# Patient Record
Sex: Female | Born: 1940
Health system: Southern US, Community
[De-identification: ages and names within clinical notes are randomized; demographics above are authoritative.]

## PROBLEM LIST (undated history)

## (undated) DIAGNOSIS — E039 Hypothyroidism, unspecified: Secondary | ICD-10-CM

## (undated) DIAGNOSIS — E785 Hyperlipidemia, unspecified: Secondary | ICD-10-CM

## (undated) DIAGNOSIS — C801 Malignant (primary) neoplasm, unspecified: Secondary | ICD-10-CM

## (undated) DIAGNOSIS — R51 Headache: Secondary | ICD-10-CM

## (undated) DIAGNOSIS — M1990 Unspecified osteoarthritis, unspecified site: Secondary | ICD-10-CM

## (undated) DIAGNOSIS — R519 Headache, unspecified: Secondary | ICD-10-CM

## (undated) DIAGNOSIS — T7840XA Allergy, unspecified, initial encounter: Secondary | ICD-10-CM

## (undated) DIAGNOSIS — D126 Benign neoplasm of colon, unspecified: Secondary | ICD-10-CM

## (undated) DIAGNOSIS — M810 Age-related osteoporosis without current pathological fracture: Secondary | ICD-10-CM

## (undated) HISTORY — DX: Hypothyroidism, unspecified: E03.9

## (undated) HISTORY — DX: Allergy, unspecified, initial encounter: T78.40XA

## (undated) HISTORY — PX: THYROIDECTOMY: SHX17

## (undated) HISTORY — DX: Hyperlipidemia, unspecified: E78.5

## (undated) HISTORY — DX: Benign neoplasm of colon, unspecified: D12.6

## (undated) HISTORY — DX: Unspecified osteoarthritis, unspecified site: M19.90

## (undated) HISTORY — DX: Headache: R51

## (undated) HISTORY — DX: Malignant (primary) neoplasm, unspecified: C80.1

## (undated) HISTORY — PX: BREAST LUMPECTOMY: SHX2

## (undated) HISTORY — PX: MEDIAL PARTIAL KNEE REPLACEMENT: SHX5965

## (undated) HISTORY — DX: Headache, unspecified: R51.9

## (undated) HISTORY — DX: Age-related osteoporosis without current pathological fracture: M81.0

## (undated) HISTORY — PX: TIBIA FRACTURE SURGERY: SHX806

---

## 1998-06-12 ENCOUNTER — Emergency Department (HOSPITAL_COMMUNITY): Admission: EM | Admit: 1998-06-12 | Discharge: 1998-06-12 | Payer: Self-pay | Admitting: Emergency Medicine

## 1999-03-02 ENCOUNTER — Other Ambulatory Visit: Admission: RE | Admit: 1999-03-02 | Discharge: 1999-03-02 | Payer: Self-pay | Admitting: Family Medicine

## 2000-10-19 ENCOUNTER — Other Ambulatory Visit: Admission: RE | Admit: 2000-10-19 | Discharge: 2000-10-19 | Payer: Self-pay | Admitting: Family Medicine

## 2003-06-20 ENCOUNTER — Encounter: Admission: RE | Admit: 2003-06-20 | Discharge: 2003-06-20 | Payer: Self-pay | Admitting: General Surgery

## 2003-06-23 ENCOUNTER — Ambulatory Visit (HOSPITAL_BASED_OUTPATIENT_CLINIC_OR_DEPARTMENT_OTHER): Admission: RE | Admit: 2003-06-23 | Discharge: 2003-06-23 | Payer: Self-pay | Admitting: General Surgery

## 2003-06-23 ENCOUNTER — Ambulatory Visit (HOSPITAL_COMMUNITY): Admission: RE | Admit: 2003-06-23 | Discharge: 2003-06-23 | Payer: Self-pay | Admitting: General Surgery

## 2003-07-23 ENCOUNTER — Ambulatory Visit: Admission: RE | Admit: 2003-07-23 | Discharge: 2003-09-17 | Payer: Self-pay | Admitting: Radiation Oncology

## 2003-10-09 ENCOUNTER — Ambulatory Visit: Admission: RE | Admit: 2003-10-09 | Discharge: 2003-10-09 | Payer: Self-pay | Admitting: Radiation Oncology

## 2004-01-22 ENCOUNTER — Inpatient Hospital Stay (HOSPITAL_COMMUNITY): Admission: RE | Admit: 2004-01-22 | Discharge: 2004-01-23 | Payer: Self-pay | Admitting: Orthopedic Surgery

## 2004-04-07 ENCOUNTER — Other Ambulatory Visit: Admission: RE | Admit: 2004-04-07 | Discharge: 2004-04-07 | Payer: Self-pay | Admitting: Family Medicine

## 2004-12-01 ENCOUNTER — Ambulatory Visit: Payer: Self-pay | Admitting: Family Medicine

## 2005-01-04 ENCOUNTER — Ambulatory Visit: Payer: Self-pay | Admitting: Oncology

## 2005-02-15 ENCOUNTER — Ambulatory Visit (HOSPITAL_COMMUNITY): Admission: RE | Admit: 2005-02-15 | Discharge: 2005-02-15 | Payer: Self-pay | Admitting: *Deleted

## 2005-05-11 ENCOUNTER — Ambulatory Visit: Payer: Self-pay | Admitting: Family Medicine

## 2005-05-18 ENCOUNTER — Ambulatory Visit: Payer: Self-pay | Admitting: Family Medicine

## 2006-01-04 ENCOUNTER — Ambulatory Visit: Payer: Self-pay | Admitting: Oncology

## 2006-01-04 LAB — CBC WITH DIFFERENTIAL/PLATELET
BASO%: 0.3 % (ref 0.0–2.0)
Basophils Absolute: 0 10*3/uL (ref 0.0–0.1)
EOS%: 1.6 % (ref 0.0–7.0)
Eosinophils Absolute: 0.1 10*3/uL (ref 0.0–0.5)
HCT: 34.6 % — ABNORMAL LOW (ref 34.8–46.6)
HGB: 11.8 g/dL (ref 11.6–15.9)
LYMPH%: 32.8 % (ref 14.0–48.0)
MCH: 30.6 pg (ref 26.0–34.0)
MCHC: 34 g/dL (ref 32.0–36.0)
MCV: 90.2 fL (ref 81.0–101.0)
MONO#: 0.3 10*3/uL (ref 0.1–0.9)
MONO%: 6 % (ref 0.0–13.0)
NEUT#: 2.9 10*3/uL (ref 1.5–6.5)
NEUT%: 59.3 % (ref 39.6–76.8)
Platelets: 185 10*3/uL (ref 145–400)
RBC: 3.84 10*6/uL (ref 3.70–5.32)
RDW: 13.6 % (ref 11.3–14.5)
WBC: 4.9 10*3/uL (ref 3.9–10.0)
lymph#: 1.6 10*3/uL (ref 0.9–3.3)

## 2006-01-18 ENCOUNTER — Ambulatory Visit (HOSPITAL_COMMUNITY): Admission: RE | Admit: 2006-01-18 | Discharge: 2006-01-18 | Payer: Self-pay | Admitting: Oncology

## 2006-06-07 ENCOUNTER — Ambulatory Visit: Payer: Self-pay | Admitting: Family Medicine

## 2006-06-07 LAB — CONVERTED CEMR LAB
ALT: 16 units/L (ref 0–40)
AST: 27 units/L (ref 0–37)
Albumin: 4 g/dL (ref 3.5–5.2)
Alkaline Phosphatase: 45 units/L (ref 39–117)
BUN: 17 mg/dL (ref 6–23)
Basophils Absolute: 0 10*3/uL (ref 0.0–0.1)
Basophils Relative: 0.5 % (ref 0.0–1.0)
CO2: 28 meq/L (ref 19–32)
Calcium: 9.3 mg/dL (ref 8.4–10.5)
Chloride: 110 meq/L (ref 96–112)
Chol/HDL Ratio, serum: 3.3
Cholesterol: 164 mg/dL (ref 0–200)
Creatinine, Ser: 0.8 mg/dL (ref 0.4–1.2)
Eosinophil percent: 2.2 % (ref 0.0–5.0)
GFR calc non Af Amer: 77 mL/min
Glomerular Filtration Rate, Af Am: 93 mL/min/{1.73_m2}
Glucose, Bld: 95 mg/dL (ref 70–99)
HCT: 39.6 % (ref 36.0–46.0)
HDL: 49.8 mg/dL (ref >39.0)
Hemoglobin: 12.9 g/dL (ref 12.0–15.0)
LDL Cholesterol: 94 mg/dL (ref 0–99)
Lymphocytes Relative: 32.3 % (ref 12.0–46.0)
MCHC: 32.5 g/dL (ref 30.0–36.0)
MCV: 93.5 fL (ref 78.0–100.0)
Monocytes Absolute: 0.4 10*3/uL (ref 0.2–0.7)
Monocytes Relative: 7.1 % (ref 3.0–11.0)
Neutro Abs: 3.4 10*3/uL (ref 1.4–7.7)
Neutrophils Relative %: 57.9 % (ref 43.0–77.0)
Platelets: 189 10*3/uL (ref 150–400)
Potassium: 3.9 meq/L (ref 3.5–5.1)
RBC: 4.23 M/uL (ref 3.87–5.11)
RDW: 12.3 % (ref 11.5–14.6)
Sodium: 144 meq/L (ref 135–145)
TSH: 5 microintl units/mL (ref 0.35–5.50)
Total Bilirubin: 0.5 mg/dL (ref 0.3–1.2)
Total Protein: 6.7 g/dL (ref 6.0–8.3)
Triglyceride fasting, serum: 100 mg/dL (ref 0–149)
VLDL: 20 mg/dL (ref 0–40)
WBC: 5.8 10*3/uL (ref 4.5–10.5)

## 2006-06-14 ENCOUNTER — Ambulatory Visit: Payer: Self-pay | Admitting: Family Medicine

## 2006-06-14 ENCOUNTER — Encounter: Payer: Self-pay | Admitting: Family Medicine

## 2006-06-14 ENCOUNTER — Other Ambulatory Visit: Admission: RE | Admit: 2006-06-14 | Discharge: 2006-06-14 | Payer: Self-pay | Admitting: Family Medicine

## 2006-06-27 ENCOUNTER — Ambulatory Visit: Payer: Self-pay | Admitting: Family Medicine

## 2006-06-28 ENCOUNTER — Ambulatory Visit: Payer: Self-pay

## 2006-12-19 ENCOUNTER — Ambulatory Visit: Payer: Self-pay | Admitting: Gastroenterology

## 2006-12-19 LAB — CONVERTED CEMR LAB
ALT: 16 units/L (ref 0–40)
AST: 22 units/L (ref 0–37)
Albumin: 3.8 g/dL (ref 3.5–5.2)
Alkaline Phosphatase: 41 units/L (ref 39–117)
BUN: 9 mg/dL (ref 6–23)
Basophils Absolute: 0 10*3/uL (ref 0.0–0.1)
Basophils Relative: 0.1 % (ref 0.0–1.0)
Bilirubin, Direct: 0.1 mg/dL (ref 0.0–0.3)
CO2: 29 meq/L (ref 19–32)
Calcium: 8.9 mg/dL (ref 8.4–10.5)
Chloride: 111 meq/L (ref 96–112)
Creatinine, Ser: 0.7 mg/dL (ref 0.4–1.2)
Eosinophils Absolute: 0.1 10*3/uL (ref 0.0–0.6)
Eosinophils Relative: 1.8 % (ref 0.0–5.0)
GFR calc Af Amer: 108 mL/min
GFR calc non Af Amer: 89 mL/min
Glucose, Bld: 95 mg/dL (ref 70–99)
HCT: 35 % — ABNORMAL LOW (ref 36.0–46.0)
Hemoglobin: 11.9 g/dL — ABNORMAL LOW (ref 12.0–15.0)
Lymphocytes Relative: 30.9 % (ref 12.0–46.0)
MCHC: 34.1 g/dL (ref 30.0–36.0)
MCV: 92.3 fL (ref 78.0–100.0)
Monocytes Absolute: 0.4 10*3/uL (ref 0.2–0.7)
Monocytes Relative: 8.4 % (ref 3.0–11.0)
Neutro Abs: 3 10*3/uL (ref 1.4–7.7)
Neutrophils Relative %: 58.8 % (ref 43.0–77.0)
Platelets: 186 10*3/uL (ref 150–400)
Potassium: 4.1 meq/L (ref 3.5–5.1)
RBC: 3.79 M/uL — ABNORMAL LOW (ref 3.87–5.11)
RDW: 12.3 % (ref 11.5–14.6)
Sodium: 143 meq/L (ref 135–145)
TSH: 2.67 microintl units/mL (ref 0.35–5.50)
Total Bilirubin: 0.6 mg/dL (ref 0.3–1.2)
Total Protein: 6.1 g/dL (ref 6.0–8.3)
WBC: 5.1 10*3/uL (ref 4.5–10.5)

## 2006-12-21 ENCOUNTER — Ambulatory Visit: Payer: Self-pay | Admitting: Gastroenterology

## 2006-12-22 ENCOUNTER — Ambulatory Visit: Payer: Self-pay | Admitting: Oncology

## 2006-12-27 LAB — CBC WITH DIFFERENTIAL/PLATELET
BASO%: 0.4 % (ref 0.0–2.0)
Basophils Absolute: 0 10*3/uL (ref 0.0–0.1)
EOS%: 2.7 % (ref 0.0–7.0)
Eosinophils Absolute: 0.1 10*3/uL (ref 0.0–0.5)
HCT: 32.3 % — ABNORMAL LOW (ref 34.8–46.6)
HGB: 11.1 g/dL — ABNORMAL LOW (ref 11.6–15.9)
LYMPH%: 39 % (ref 14.0–48.0)
MCH: 31.5 pg (ref 26.0–34.0)
MCHC: 34.6 g/dL (ref 32.0–36.0)
MCV: 91 fL (ref 81.0–101.0)
MONO#: 0.4 10*3/uL (ref 0.1–0.9)
MONO%: 7.8 % (ref 0.0–13.0)
NEUT#: 2.4 10*3/uL (ref 1.5–6.5)
NEUT%: 50.1 % (ref 39.6–76.8)
Platelets: 170 10*3/uL (ref 145–400)
RBC: 3.54 10*6/uL — ABNORMAL LOW (ref 3.70–5.32)
RDW: 13 % (ref 11.3–14.5)
WBC: 4.8 10*3/uL (ref 3.9–10.0)
lymph#: 1.9 10*3/uL (ref 0.9–3.3)

## 2006-12-27 LAB — COMPREHENSIVE METABOLIC PANEL
ALT: 14 U/L (ref 0–35)
AST: 20 U/L (ref 0–37)
Albumin: 4.3 g/dL (ref 3.5–5.2)
Alkaline Phosphatase: 41 U/L (ref 39–117)
BUN: 12 mg/dL (ref 6–23)
CO2: 26 mEq/L (ref 19–32)
Calcium: 8.3 mg/dL — ABNORMAL LOW (ref 8.4–10.5)
Chloride: 109 mEq/L (ref 96–112)
Creatinine, Ser: 0.7 mg/dL (ref 0.40–1.20)
Glucose, Bld: 94 mg/dL (ref 70–99)
Potassium: 3.6 mEq/L (ref 3.5–5.3)
Sodium: 144 mEq/L (ref 135–145)
Total Bilirubin: 0.3 mg/dL (ref 0.3–1.2)
Total Protein: 6.2 g/dL (ref 6.0–8.3)

## 2006-12-27 LAB — CANCER ANTIGEN 27.29: CA 27.29: 13 U/mL (ref 0–39)

## 2007-06-07 ENCOUNTER — Other Ambulatory Visit: Admission: RE | Admit: 2007-06-07 | Discharge: 2007-06-07 | Payer: Self-pay | Admitting: Family Medicine

## 2007-12-31 ENCOUNTER — Ambulatory Visit: Payer: Self-pay | Admitting: Oncology

## 2008-01-28 LAB — CBC WITH DIFFERENTIAL/PLATELET
BASO%: 0.4 % (ref 0.0–2.0)
Basophils Absolute: 0 10*3/uL (ref 0.0–0.1)
EOS%: 2 % (ref 0.0–7.0)
Eosinophils Absolute: 0.1 10*3/uL (ref 0.0–0.5)
HCT: 35.3 % (ref 34.8–46.6)
HGB: 12.3 g/dL (ref 11.6–15.9)
LYMPH%: 39.5 % (ref 14.0–48.0)
MCH: 30.7 pg (ref 26.0–34.0)
MCHC: 34.8 g/dL (ref 32.0–36.0)
MCV: 88.4 fL (ref 81.0–101.0)
MONO#: 0.5 10*3/uL (ref 0.1–0.9)
MONO%: 8.6 % (ref 0.0–13.0)
NEUT#: 3 10*3/uL (ref 1.5–6.5)
NEUT%: 49.5 % (ref 39.6–76.8)
Platelets: 186 10*3/uL (ref 145–400)
RBC: 3.99 10*6/uL (ref 3.70–5.32)
RDW: 13.5 % (ref 11.3–14.5)
WBC: 6.1 10*3/uL (ref 3.9–10.0)
lymph#: 2.4 10*3/uL (ref 0.9–3.3)

## 2008-01-28 LAB — COMPREHENSIVE METABOLIC PANEL
ALT: 11 U/L (ref 0–35)
AST: 21 U/L (ref 0–37)
Albumin: 3.8 g/dL (ref 3.5–5.2)
Alkaline Phosphatase: 50 U/L (ref 39–117)
BUN: 13 mg/dL (ref 6–23)
CO2: 28 mEq/L (ref 19–32)
Calcium: 8.9 mg/dL (ref 8.4–10.5)
Chloride: 108 mEq/L (ref 96–112)
Creatinine, Ser: 0.64 mg/dL (ref 0.40–1.20)
Glucose, Bld: 84 mg/dL (ref 70–99)
Potassium: 3.7 mEq/L (ref 3.5–5.3)
Sodium: 141 mEq/L (ref 135–145)
Total Bilirubin: 0.6 mg/dL (ref 0.3–1.2)
Total Protein: 6.2 g/dL (ref 6.0–8.3)

## 2008-01-29 LAB — VITAMIN D 25 HYDROXY (VIT D DEFICIENCY, FRACTURES): Vit D, 25-Hydroxy: 40 ng/mL (ref 30–89)

## 2008-01-29 LAB — CANCER ANTIGEN 27.29: CA 27.29: 20 U/mL (ref 0–39)

## 2008-12-05 ENCOUNTER — Ambulatory Visit: Payer: Self-pay | Admitting: Oncology

## 2008-12-09 ENCOUNTER — Encounter: Payer: Self-pay | Admitting: Gastroenterology

## 2008-12-09 LAB — CBC WITH DIFFERENTIAL/PLATELET
BASO%: 0 % (ref 0.0–2.0)
Basophils Absolute: 0 10*3/uL (ref 0.0–0.1)
EOS%: 1.9 % (ref 0.0–7.0)
Eosinophils Absolute: 0.1 10*3/uL (ref 0.0–0.5)
HCT: 34.8 % (ref 34.8–46.6)
HGB: 12.1 g/dL (ref 11.6–15.9)
LYMPH%: 29.3 % (ref 14.0–49.7)
MCH: 31.3 pg (ref 25.1–34.0)
MCHC: 34.7 g/dL (ref 31.5–36.0)
MCV: 90.3 fL (ref 79.5–101.0)
MONO#: 0.3 10*3/uL (ref 0.1–0.9)
MONO%: 5.4 % (ref 0.0–14.0)
NEUT#: 4 10*3/uL (ref 1.5–6.5)
NEUT%: 63.4 % (ref 38.4–76.8)
Platelets: 153 10*3/uL (ref 145–400)
RBC: 3.85 10*6/uL (ref 3.70–5.45)
RDW: 13.4 % (ref 11.2–14.5)
WBC: 6.2 10*3/uL (ref 3.9–10.3)
lymph#: 1.8 10*3/uL (ref 0.9–3.3)

## 2008-12-09 LAB — COMPREHENSIVE METABOLIC PANEL
ALT: 14 U/L (ref 0–35)
AST: 22 U/L (ref 0–37)
Albumin: 3.6 g/dL (ref 3.5–5.2)
Alkaline Phosphatase: 45 U/L (ref 39–117)
BUN: 19 mg/dL (ref 6–23)
CO2: 29 mEq/L (ref 19–32)
Calcium: 8.9 mg/dL (ref 8.4–10.5)
Chloride: 110 mEq/L (ref 96–112)
Creatinine, Ser: 0.67 mg/dL (ref 0.40–1.20)
Glucose, Bld: 89 mg/dL (ref 70–99)
Potassium: 3.8 mEq/L (ref 3.5–5.3)
Sodium: 142 mEq/L (ref 135–145)
Total Bilirubin: 0.4 mg/dL (ref 0.3–1.2)
Total Protein: 6 g/dL (ref 6.0–8.3)

## 2009-03-27 ENCOUNTER — Encounter (INDEPENDENT_AMBULATORY_CARE_PROVIDER_SITE_OTHER): Payer: Self-pay | Admitting: *Deleted

## 2010-12-14 NOTE — Letter (Signed)
Dec 19, 2006    Amy Asal, MD  [address] [place], [place] [address]   RE:  Amy Clements, Amy Clements  MRN:  [id number]  /  DOB:  Nov 18, 1940   Dear Dr. Scotty Court:   Upon your kind referral, I had the pleasure of evaluating your patient  and I am pleased to offer my findings.  I saw Amy Clements in the  office today.  Enclosed is a copy of my progress note that details my  findings and recommendations.   Thank you for the opportunity to participate in your patient's care.    Sincerely,      Barbette Hair. Arlyce Dice, MD,FACG  Electronically Signed    RDK/MedQ  DD: 12/19/2006  DT: 12/19/2006  Job #: [id number]

## 2010-12-14 NOTE — Assessment & Plan Note (Signed)
 HEALTHCARE                         GASTROENTEROLOGY OFFICE NOTE   TUJUANA, KILMARTIN                       MRN:          [id number]  DATE:12/19/2006                            DOB:          01-22-1941    REASON FOR CONSULTATION:  Rectal bleeding.   Ms. Antenucci is a pleasant 70 year old white female referred through the  courtesy of Dr. Scotty Court for evaluation.  She has noted bright red blood  per rectum consisting of small amounts of blood on the toilet tissue.  She does complain of some rectal soreness.  She denies change of bowel  habits or abdominal pain.  She also complains of chronic fatigue.  She  has lost 20 pounds in the past year.  She attributes this to stress  related to taking care of her elderly mother and mother-in-law.   PAST MEDICAL HISTORY:  Pertinent for breast cancer diagnosed in 2005,  and treated with lumpectomy and radiation therapy.  She is on adjuvant  tamoxifen.  Has a history of thyroid disease, and arthritis.  She is  status post hysterectomy.   FAMILY HISTORY:  Pertinent for heart disease in her mother.   Medications include thyroid and tamoxifen.   SHE HAS NO ALLERGIES.   She neither smokes nor drinks.  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   REVIEW OF SYSTEMS:  Positive for joint pains, sleeping problems, back  pain, and anxiety.   EXAMINATION:  Pulse 72.  Blood pressure 132/76.  Weight 127.  HEENT: EOMI. PERRLA. Sclerae are anicteric.  Conjunctivae are pink.  NECK:  Supple without thyromegaly, adenopathy or carotid bruits.  CHEST:  Clear to auscultation and percussion without adventitious  sounds.  CARDIAC:  Regular rhythm; normal S1 S2.  There are no murmurs, gallops  or rubs.  ABDOMEN:  Bowel sounds are normoactive.  Abdomen is soft, non-tender and  non-distended.  There are no abdominal masses, tenderness, splenic  enlargement or hepatomegaly.  EXTREMITIES:  Full range of motion.  No cyanosis,  clubbing or edema.  RECTAL:  She has external skin tags.  There are no masses.  Stool is  Hemoccult negative.   IMPRESSION:  1. Limited rectal bleeding.  Most likely secondary to hemorrhoids.      More proximal colonic source ought to be ruled out.  2. Abnormal weight loss.  This could be stress related.  3. History of breast cancer.   RECOMMENDATION:  1. Colonoscopy.  2. Check CBC, CMET, T4 and TSH.     Barbette Hair. Arlyce Dice, MD,FACG  Electronically Signed    RDK/MedQ  DD: 12/19/2006  DT: 12/19/2006  Job #: [id number]   cc:   Ellin Saba., MD  Valentino Hue Magrinat, M.D.

## 2010-12-17 NOTE — Op Note (Signed)
NAMEFARRYN, LINARES                          ACCOUNT NO.:  [id number]   MEDICAL RECORD NO.:  [id number]                   PATIENT TYPE:  INP   LOCATION:  X002                                 FACILITY:  [facility]   PHYSICIAN:  Madlyn Frankel. Charlann Boxer, M.D.               DATE OF BIRTH:  09/18/1940   DATE OF PROCEDURE:  01/22/2004  DATE OF DISCHARGE:                                 OPERATIVE REPORT   PREOPERATIVE DIAGNOSIS:  Left knee medial compartment osteoarthritis.   POSTOPERATIVE DIAGNOSIS:  Left knee medial compartment osteoarthritis.   PROCEDURE:  Left knee unicompartmental knee replacement utilizing a Biomet  Oxford knee system with a small femur, a size 4 mm meniscal bearing  polyethylene, and a left medial tibial tray measuring 41 x 26 mm.   SURGEON:  Durene Romans, MD   ASSISTANT:  Clarene Reamer, P.A.-C.   ANESTHESIA:  General.   TOURNIQUET TIME:  88 minutes at 300 mmHg.   BLOOD LOSS:  50 mL.   DRAINS:  One.   INDICATION FOR PROCEDURE:  Ms. Boccio is a very pleasant 70 year old female,  who has been followed for some time with medial compartment arthritis.  She  was most recently seen within the past couple of weeks after undergoing  treatment for breast cancer.  She continues to have persistent problems in  the right knee despite conservative measures.  Upon review of her  radiographs and the bone-on-bone nature of the medial compartment, stress x-  rays were ordered.  She had maintenance of her medial collateral ligament  and preservation of lateral compartment cartilage with correction of her  arthritis medially.  On the lateral x-rays, she had evidence of anterior  medial wear indicating a functional, intact anterior cruciate ligament.  After discussing risks and benefits including potential diagnostic and upper  arthroscopy versus unicompartmental knee replacement, she feels that she had  failed all other measures and based on her radiographic appearance, there  was  probably going to be very little success in knee arthroscopy.  After  discussing risks and benefits of the unicompartmental knee replacement, she  consents for above procedure.   PROCEDURE IN DETAIL:  The patient was brought to the operative theatre.  Once adequate anesthesia and preoperative antibiotics were administered, 1 g  of Ancef, the patient was positioned supine with the left leg in the leg  holder.  Left lower extremity was positioned such that flexion of to 120  degrees was attainable.  The left lower extremity was then prepped and  draped over a proximal thigh tourniquet.  A slightly off midline incision  was made, basically from the mid portion of the patella down just proximal  to the tibial tubercle.  Medial parapatellar arthrotomy was carried out.  Debridement of the soft tissue was carried out as well as minor soft tissue  debridement medially.  With this exposure, attention was directed to the  tibial cut  first with the external tibial guide placed parallel to the  tibial shaft.  Osteophytes had been removed off the femoral condyle to allow  for a reciprocating saw placed along the lateral border of the medial  femoral condyle aimed towards the hip center.  Reciprocating saw was taken  down.  Note that initially a conservative cut was made and then after a  trial with a size 4 spacer that was too tight, we resected 3 mm more of  bone.  At this, with the trial component and the 4 mm spacer, this gapped  perfectly.  With this, the size 3 spacer was placed followed by placement of  the femoral drill hole positioner.  Note that the intramedullary device from  the femoral canal was placed approximately 1 cm anterior to the anteromedial  corner of the femoral notch.  With this in position and used as a reference  guide, the femoral drill guide was placed flush onto the femoral condyle and  then parallel in both the sagittal and coronal views.  The position of the  drill guide  in the center portion of the medial lateral aspect of the medial  femoral condyle.  With this in position, drill holes were drilled.  Following this, the posterior femoral cut was made with the jig in place.  At this point, a 0 spigot was placed and the milling device placed over the  medial femoral condyle.  Following removal of remaining bone fragments on  the posterior aspects of the medial and lateral aspect of the medial femoral  condyle, trial reduction was carried out with a small femur and the tibial  component in position.  Initial spacing and flexion determined that the size  4 fit perfectly but in flexion, only a 1 mm jig was able to be placed.  Based on this determination, a 3 mm bone needed to be resected, so a size 3  spigot was placed, and milling commenced on the femur as such.  Further bone  was removed from the central portion as well as around the rim.  At this  point, trial reduction was carried out with size 4 spacer, and the knee came  to full extension and flexion, was stable with good mobility of the meniscal  bearing prosthesis.  Note that procedure was carried out per protocol and  including examination of the spacing block with the knee in 20 degrees of  flexion, not full extension.  Following this trial reduction, attention was  directed at final preparation of the tibial component.  Using the  perpendicular hook device and assuring that the tibial component was as far  posterior as possible with no overhang, it was pinned into position and the  trough created.  Following preparation of the tibial component, final  components were brought to the field.  The knee was copiously irrigated with  normal saline solution.   Utilizing a two-stage cementing technique, the tibial component was cemented  into position first.  Excessive cement was removed with attention to remove  excessive cement posteriorly.  Note that with the tibial component in position, the trial  femur was positioned with the 4 mm poly liner and knee  held at 45 degrees flexion with axial pressure to allow for cement curing.  Excessive cement was removed.  The knee was then reirrigated and tried and  the femoral components put in position again.  Excessive cement was removed,  and the knee was held at 45 degrees with the  4 mm spacer in place.  Following further debridement and assuring there was no further cement  fragments visible, a 4 mm size small meniscal bearing final polyethylene  component was placed into the knee with the snapping sound expected.  Further debridement off of the anterior aspect of the medial femoral condyle  was carried out to prevent impingement of this meniscal bearing, prosthesis  in full extension.  At this point, the knee was copiously irrigated with  normal saline solution; tourniquet was let down.  Further debridement, and  then hemostasis was obtained.  Medium Hemovac drain was placed.  The wound  was then closed in layers with the extensor mechanism reapproximated with a  #1 PDS followed by a running 2-0 underneath the subcutaneous layer and a  running 4-0 Monocryl on the skin.  The patient's wound was then cleaned and  dried and dressed sterilely with Steri-Strips, dressing, sponges, and tape.  The patient tolerated the procedure without complication, was transferred to  the recovery room and extubated.                                               Madlyn Frankel Charlann Boxer, M.D.    MDO/MEDQ  D:  01/22/2004  T:  01/22/2004  Job:  [id number]

## 2010-12-17 NOTE — H&P (Signed)
NAMECLAUDETT, Amy Clements                          ACCOUNT NO.:  [id number]   MEDICAL RECORD NO.:  [id number]                   PATIENT TYPE:   LOCATION:                                       FACILITY:   PHYSICIAN:  Madlyn Frankel. Charlann Boxer, M.D.               DATE OF BIRTH:   DATE OF ADMISSION:  01/22/2004  DATE OF DISCHARGE:                                HISTORY & PHYSICAL   CHIEF COMPLAINT:  Left knee pain.   HISTORY OF PRESENT ILLNESS:  Patient is a 70 year old female who was last  seen by Dr. Charlann Boxer in September, 2004 regarding her left knee.  She has been  followed by Dr. Montez Morita for some time with known medial degenerative changes.  There was a previous discussion of left unicompartmental knee replacements  versus knee arthroscopy.  In the interim since her last visit, she was  diagnosed with breast cancer and undergone a lumpectomy.  She is currently  on Tamoxifen treatment for the next five years.  She continues to have  worsening symptoms in the left knee over the past few months.  She had  previously has had a cortisone injection that only provided relief for a few  weeks.  She continues to take Relafen without much relief of symptoms.  She  has also had some complaints of lower lumbar pain; however, her primary  issue at this point remains her left knee.   Radiographs in the office reveal significant medial compartmental joint  disease.  A stress view of her left knee was obtained, given the fact that  her standing view reveals a close-down medial joint space and bone-on-bone.  Stress radiographs at 20 degrees of flexion and valgus stress reveals that  she has intact lateral compartment and that her deformity is correctable.  She has an intact medial collateral ligament.  Upon these findings, Dr. Charlann Boxer  feels that it is best to proceed with a unicompartmental left knee  replacement.  The patient agrees.  The risks and benefits of the surgery  were discussed with the patient, and the  patient wishes to proceed.   PAST MEDICAL HISTORY:  1. Hypothyroidism.  2. Osteoarthritis.  3. Osteopenia.  4. History of breast cancer.   PAST SURGICAL HISTORY:  1. Thyroidectomy.  2. Hysterectomy.  3. Bladder tack.  4. Tonsillectomy.   MEDICATIONS:  1. Armour thyroid 120 mg 1 p.o. daily.  2. Tamoxifen 20 mg 1 p.o. daily.  3. Multivitamin 1 p.o. daily.  4. Vitamin E 1 p.o. daily.  5. Relafen 750 mg  1 p.o. b.i.d.  6. Caltrate 600 plus D 1 p.o. b.i.d.   ALLERGIES:  No known drug allergies.   SOCIAL HISTORY:  Patient denies any tobacco or alcohol use.  She is married.  She lives in a [place] house with four steps entering her house.   FAMILY HISTORY:  Father has dementia and Parkinson's.  Mother has  hypertension, congestive heart failure, dry mucus membranes, and  osteoarthritis.   REVIEW OF SYSTEMS:  GENERAL:  Positive night sweats and fevers, chills, and  bleeding tendencies.  CNS:  No blurry or double vision, seizures, headaches,  paralysis.  RESPIRATORY:  Denies shortness of breath, productive cough or  hemoptysis.  CV:  Denies chest pain or orthopnea.  GI:  Denies nausea,  vomiting, diarrhea, constipation, melena, or bloody stools.  GU:  Denies  dysuria, hematuria, or discharge.  MUSCULOSKELETAL:  Pertinent to HPI.   PHYSICAL EXAMINATION:  VITAL SIGNS:  Blood pressure 122/74, temp 97.7, pulse  74, respirations 20.  GENERAL:  A well-developed and well-nourished 70 year old female.  HEENT:  Normocephalic and atraumatic.  Pupils are equal, round and reactive  to light.  NECK:  Supple.  No carotid bruits.  LUNGS:  Clear to auscultation bilaterally.  No wheezes or crackles.  HEART:  Regular rate and rhythm without murmurs, rubs or gallops.  ABDOMEN:  Soft, nontender, nondistended.  Positive bowel sounds x4.  EXTREMITIES:  She has range of motion from 0 to 130 degrees.  She has medial  pain on her left knee with intact ligaments.  She is neurovascularly intact   distally.  SKIN:  No rashes or lesions.   Radiographic reveals significant medial compartmental joint disease.   IMPRESSION:  1. Medial compartmental osteoarthritis of the left knee.  2. Hypothyroidism.  3. Osteoarthritis.  4. Osteopenia.  5. History of breast cancer.   PLAN:  Patient will be admitted to [facility] on January 22, 2004 to  undergo unicompartmental left knee replacement versus a total knee  replacement by Dr. Durene Romans.     Clarene Reamer, P.A.-C.                   Madlyn Frankel Charlann Boxer, M.D.    SW/MEDQ  D:  01/21/2004  T:  01/21/2004  Job:  [id number]   cc:   Ellin Saba., M.D.  [address] [place]  [place] [address]  Fax: [phone]

## 2012-09-17 ENCOUNTER — Ambulatory Visit
Admission: RE | Admit: 2012-09-17 | Discharge: 2012-09-17 | Disposition: A | Discharge status: Home / Self Care | Payer: Medicare Other | Source: Ambulatory Visit | Attending: Physician Assistant | Admitting: Physician Assistant

## 2012-09-17 ENCOUNTER — Other Ambulatory Visit: Payer: Self-pay | Admitting: Physician Assistant

## 2012-09-17 DIAGNOSIS — S0990XA Unspecified injury of head, initial encounter: Secondary | ICD-10-CM

## 2012-09-17 DIAGNOSIS — R1110 Vomiting, unspecified: Secondary | ICD-10-CM

## 2012-09-17 DIAGNOSIS — R51 Headache: Secondary | ICD-10-CM

## 2012-09-17 DIAGNOSIS — R42 Dizziness and giddiness: Secondary | ICD-10-CM

## 2013-05-01 ENCOUNTER — Other Ambulatory Visit: Payer: Self-pay | Admitting: Family Medicine

## 2013-05-01 DIAGNOSIS — R51 Headache: Secondary | ICD-10-CM

## 2013-05-06 ENCOUNTER — Ambulatory Visit
Admission: RE | Admit: 2013-05-06 | Discharge: 2013-05-06 | Disposition: A | Discharge status: Home / Self Care | Payer: Medicare Other | Source: Ambulatory Visit | Attending: Family Medicine | Admitting: Family Medicine

## 2013-05-06 DIAGNOSIS — R51 Headache: Secondary | ICD-10-CM

## 2013-05-16 ENCOUNTER — Ambulatory Visit (INDEPENDENT_AMBULATORY_CARE_PROVIDER_SITE_OTHER): Payer: Medicare Other | Admitting: Neurology

## 2013-05-16 ENCOUNTER — Encounter: Payer: Self-pay | Admitting: Neurology

## 2013-05-16 VITALS — BP 130/72 | HR 66 | Temp 97.9°F | Ht 63.0 in | Wt 143.0 lb

## 2013-05-16 DIAGNOSIS — G44301 Post-traumatic headache, unspecified, intractable: Secondary | ICD-10-CM

## 2013-05-16 DIAGNOSIS — G44309 Post-traumatic headache, unspecified, not intractable: Secondary | ICD-10-CM

## 2013-05-16 NOTE — Progress Notes (Signed)
NEUROLOGY CONSULTATION NOTE  Amy Clements MRN: [id number] DOB: [birth year]  Referring provider: Dr. Nathanial Rancher Primary care provider: Dr. Nathanial Rancher  Reason for consult:  Chronic daily headaches  HISTORY OF PRESENT ILLNESS: Amy Clements is a 72 year old right-handed woman with history of post-surgical hypothyroidism and remote history of breast cancer, who presents for headache.  She is accompanied by her husband.  Records and images were personally reviewed where available.   Onset of headaches began after a fall in March 2014.  She was walking down the steps of a semi-tractor when she fell backwards, hitting the back of her head.  She did not lose consciousness.  She did sustain a scalp laceration but did not require stitches.  She felt fairly okay the rest of the day.  However, the next day she developed severe headache.  The headache is located on the top of her head, but changes locations.  It is not located front, side or back of head.  Initially it was severe throbbing pain, 8-9/10.  Now it is a dull non-throbbing pain, 4-5/10.  She denies any acute neck pain.  She notes mild phonophobia but it is not associated with nausea, vomiting, photophobia, or osmophobia.  She is usually able to work through the headache.  It occurs daily, usually worse at night.  Sometimes, it wakes her up at night.  It can last anywhere from a few minutes to 2 hours.  She usually takes Tylenol, which doesn't really help.  She takes it anywhere from 2 to 3 times a week.  She has tried tramadol and ibuprofen as well, which were ineffective.  She usually does not like taking medications.  She denies visual disturbance, change in hearing, facial numbness, slurred speech or ataxia.  At the time of injury, she had a CT scan of the head (dated February 17, but patient tells me it was March).  It revealed mild cerebral and cerebellar atrophy with chronic small vessel ischemic changes in the periventricular white matter.  No  displaced skull fracture or hemorrhage was seen.  As the symptoms persisted, she had a repeat CT of head performed 05/06/13, which was stable.  She sees a [facility] once a month for her back, which seems to help the headache briefly.  She had her eye prescription changed, which didn't help with the headache.  She denies difficulty sleeping or depression.  She does have a history of "premenstrual headaches" and headaches several years ago which spontaneously resolved.  PAST MEDICAL HISTORY: Past Medical History  Diagnosis Date  . Hypothyroidism     PAST SURGICAL HISTORY: Past Surgical History  Procedure Laterality Date  . Thyroidectomy    . Tibia fracture surgery    . Breast lumpectomy    . Medial partial knee replacement      MEDICATIONS: No current outpatient prescriptions on file prior to visit.   No current facility-administered medications on file prior to visit.    ALLERGIES: No Known Allergies  FAMILY HISTORY: No family history on file.  SOCIAL HISTORY: History   Social History  . Marital Status: Married    Spouse Name: N/A    Number of Children: N/A  . Years of Education: N/A   Occupational History  . Not on file.   Social History Main Topics  . Smoking status: Never Smoker   . Smokeless tobacco: Never Used  . Alcohol Use: No  . Drug Use: No  . Sexual Activity: Not on file   Other Topics Concern  .  Not on file   Social History Narrative  . No narrative on file    REVIEW OF SYSTEMS: Constitutional: No fevers, chills, or sweats, no generalized fatigue, change in appetite Eyes: No visual changes, double vision, eye pain Ear, nose and throat: No hearing loss, ear pain, nasal congestion, sore throat Cardiovascular: No chest pain, palpitations Respiratory:  No shortness of breath at rest or with exertion, wheezes GastrointestinaI: No nausea, vomiting, diarrhea, abdominal pain, fecal incontinence Genitourinary:  No dysuria, urinary retention or  frequency Musculoskeletal:  No neck pain, back pain Integumentary: No rash, pruritus, skin lesions Neurological: as above Psychiatric: No depression, insomnia, anxiety Endocrine: No palpitations, fatigue, diaphoresis, mood swings, change in appetite, change in weight, increased thirst Hematologic/Lymphatic:  No anemia, purpura, petechiae. Allergic/Immunologic: no itchy/runny eyes, nasal congestion, recent allergic reactions, rashes  PHYSICAL EXAM: Filed Vitals:   05/16/13 0832  BP: 130/72  Pulse: 66  Temp: 97.9 F (36.6 C)   General: No acute distress Head:  Normocephalic/atraumatic Neck: supple, no paraspinal tenderness, full range of motion Back: No paraspinal tenderness Heart: regular rate and rhythm Lungs: Clear to auscultation bilaterally. Vascular: No carotid bruits. Neurological Exam: Mental status: alert and oriented to person, place, and time, speech fluent and not dysarthric, language intact. Cranial nerves: CN I: not tested CN II: pupils equal, round and reactive to light, visual fields intact, fundi unremarkable. CN III, IV, VI:  full range of motion, no nystagmus, no ptosis CN V: facial sensation intact CN VII: upper and lower face symmetric CN VIII: hearing intact CN IX, X: gag intact, uvula midline CN XI: sternocleidomastoid and trapezius muscles intact CN XII: tongue midline Bulk & Tone: normal, no fasciculations. Motor: 5/5 throughout Sensation: pinprick and vibration intact Deep Tendon Reflexes: 2+ throughout, toes down Finger to nose testing: normal without dysmetria Heel to shin: normal without dysmetria Gait: normal stance and stride.  Able to walk on toes, heels and in tandem. Romberg negative.  IMPRESSION: Post-traumatic headaches, intractable.  Actually improving in intensity but still daily.  I do not suspect anything serious that would warrant MRI at this time.  Suggested pharmacological options, but she would like to try alternative  therapy.  PLAN: 1.  Ask chiropractor about performing adjustments of the neck.  Alternatively, we can refer to physical therapy for deep tissue massage of neck. 2.  Limit use of pain medications to no more than 2 days out of the week to prevent rebound headache. 3.  Stay hydrated and get proper sleep. 4.  Follow up in 2 months.  45 minutes spent with patient and husband, over 50% spent counseling and coordinating care.  Thank you for allowing me to take part in the care of this patient.  Shon Millet, DO  CC:  Burnell Blanks, MD

## 2013-05-16 NOTE — Patient Instructions (Signed)
Likely you have chronic headaches due to the head injury.  Your neurological exam is normal, so I do not suspect anything serious. 1.  Ask your chiropractor about performing adjustments of the neck.  Alternatively, we can refer you to physical therapy for deep tissue massage of neck. 2.  Limit use of pain medications to no more than 2 days out of the week to prevent rebound headache. 3.  Stay hydrated and get proper sleep. 4.  Follow up in 2 months.

## 2013-07-16 ENCOUNTER — Ambulatory Visit (INDEPENDENT_AMBULATORY_CARE_PROVIDER_SITE_OTHER): Payer: Medicare Other | Admitting: Neurology

## 2013-07-16 ENCOUNTER — Encounter: Payer: Self-pay | Admitting: Neurology

## 2013-07-16 VITALS — BP 128/76 | HR 72 | Temp 98.1°F | Ht 63.0 in | Wt 144.0 lb

## 2013-07-16 DIAGNOSIS — G44309 Post-traumatic headache, unspecified, not intractable: Secondary | ICD-10-CM

## 2013-07-16 NOTE — Patient Instructions (Addendum)
Ask your chiropractor about seeing him for a neck adjustment on a regular basis to help prevent the headaches.  Follow up in 3 months.

## 2013-07-16 NOTE — Progress Notes (Signed)
NEUROLOGY FOLLOW UP OFFICE NOTE  Amy Clements [id number]  HISTORY OF PRESENT ILLNESS: Amy Clements is a 72 year old right-handed woman with history of premenstrual migraines, post-surgical hypothyroidism and remote history of breast cancer, who follows up for post-traumatic headaches.  She is accompanied by her husband.  Records and images were personally reviewed where available.    Onset of headaches began after a fall in March 2014.  She was walking down the steps of a semi-tractor when she fell backwards, hitting the back of her head.  She did not lose consciousness.  She did sustain a scalp laceration but did not require stitches.  She felt fairly okay the rest of the day.  However, the next day she developed severe headache.  The headache is located on the top of her head, but changes locations.  It is not located front, side or back of head.  Initially it was severe throbbing pain, 8-9/10.  Now it is a dull non-throbbing pain, 4-5/10.  She denies any acute neck pain.  She denies visual disturbance, change in hearing, facial numbness, slurred speech or ataxia.  She notes mild phonophobia but it is not associated with nausea, vomiting, photophobia, or osmophobia. She is usually able to work through the headache.  It occurs daily, usually worse at night.  Sometimes, it wakes her up at night.  It can last anywhere from a few minutes to 2 hours.   Last visit, I suggested seeing her chiropractor for neck adjustments.  She started seeing him when the headaches were bad, and they resolved the headache.  Since last visit, she has a headache about 15 days out of the week.  They are not debilitating and are manageable.  They usually last a few minutes to an hour.  She only takes medication if it lasts at least 2-3 hours and that only occurs about 4 days out of the month.  She will usually take Tylenol, which helps.  At the time of injury, she had a CT scan of the head (dated February 17, but patient  tells me it was March).  It revealed mild cerebral and cerebellar atrophy with chronic small vessel ischemic changes in the periventricular white matter.  No displaced skull fracture or hemorrhage was seen.  As the symptoms persisted, she had a repeat CT of head performed 05/06/13, which was stable.    Therapies tried:  Tylenol, tramadol, ibuprofen (ineffective), chiropractor  PAST MEDICAL HISTORY: Past Medical History  Diagnosis Date  . Hypothyroidism   . HA (headache)     MEDICATIONS: Current Outpatient Prescriptions on File Prior to Visit  Medication Sig Dispense Refill  . Ascorbic Acid (VITAMIN C) 1000 MG tablet Take 1,000 mg by mouth daily.      Marland Kitchen aspirin 81 MG tablet Take 81 mg by mouth daily.      . fish oil-omega-3 fatty acids 1000 MG capsule Take 2 g by mouth daily.      Marland Kitchen glucosamine-chondroitin 500-400 MG tablet Take 1 tablet by mouth 3 (three) times daily.      Marland Kitchen levothyroxine (SYNTHROID, LEVOTHROID) 125 MCG tablet       . multivitamin-iron-minerals-folic acid (CENTRUM) chewable tablet Chew 1 tablet by mouth daily.      . pravastatin (PRAVACHOL) 20 MG tablet       . Vitamin D, Ergocalciferol, (DRISDOL) 50000 UNITS CAPS capsule Take 50,000 Units by mouth.      . traMADol (ULTRAM) 50 MG tablet Take 50 mg by mouth  every 6 (six) hours as needed for pain.       No current facility-administered medications on file prior to visit.    ALLERGIES: No Known Allergies  FAMILY HISTORY: Family History  Problem Relation Age of Onset  . Ataxia Neg Hx   . Chorea Neg Hx   . Dementia Neg Hx   . Mental retardation Neg Hx   . Migraines Neg Hx   . Multiple sclerosis Neg Hx   . Neurofibromatosis Neg Hx   . Neuropathy Neg Hx   . Parkinsonism Neg Hx   . Seizures Neg Hx   . Stroke Neg Hx     SOCIAL HISTORY: History   Social History  . Marital Status: Married    Spouse Name: N/A    Number of Children: N/A  . Years of Education: N/A   Occupational History  . Not on file.    Social History Main Topics  . Smoking status: Never Smoker   . Smokeless tobacco: Never Used  . Alcohol Use: No  . Drug Use: No  . Sexual Activity: Not on file   Other Topics Concern  . Not on file   Social History Narrative  . No narrative on file    REVIEW OF SYSTEMS: Constitutional: No fevers, chills, or sweats, no generalized fatigue, change in appetite Eyes: No visual changes, double vision, eye pain Ear, nose and throat: No hearing loss, ear pain, nasal congestion, sore throat Cardiovascular: No chest pain, palpitations Respiratory:  No shortness of breath at rest or with exertion, wheezes GastrointestinaI: No nausea, vomiting, diarrhea, abdominal pain, fecal incontinence Genitourinary:  No dysuria, urinary retention or frequency Musculoskeletal:  No neck pain, back pain Integumentary: No rash, pruritus, skin lesions Neurological: as above Psychiatric: No depression, insomnia, anxiety Endocrine: No palpitations, fatigue, diaphoresis, mood swings, change in appetite, change in weight, increased thirst Hematologic/Lymphatic:  No anemia, purpura, petechiae. Allergic/Immunologic: no itchy/runny eyes, nasal congestion, recent allergic reactions, rashes  PHYSICAL EXAM: Filed Vitals:   07/16/13 0843  BP: 128/76  Pulse: 72  Temp: 98.1 F (36.7 C)   General: No acute distress Head:  Normocephalic/atraumatic Neck: supple, no paraspinal tenderness, full range of motion Heart:  Regular rate and rhythm Lungs:  Clear to auscultation bilaterally Back: No paraspinal tenderness Neurological Exam: alert and oriented to person, place, and time. Speech fluent and not dysarthric, language intact.  CN II-XII intact. Bulk and tone normal, muscle strength 5/5 throughout.  Sensation to light touch intact.  Deep tendon reflexes 1+ throughout.  Finger to nose testing intact.  Gait normal, Romberg negative.  IMPRESSION: Post-traumatic headaches, improved.  PLAN: Advised that she get  more regular neck adjustments to serve as a headache preventative, rather than waiting until a headache occurs.  Follow up in 3 months.  30 minutes spent with patient and husband, over 50% spent counseling and coordinating care.  Shon Millet, DO  CC:  Burnell Blanks, MD

## 2013-07-31 ENCOUNTER — Other Ambulatory Visit: Payer: Self-pay | Admitting: Gastroenterology

## 2013-10-17 ENCOUNTER — Ambulatory Visit: Payer: Medicare Other | Admitting: Neurology

## 2014-02-10 IMAGING — CT CT HEAD W/O CM
2 series · 16 of 30 positions shown, 18 images · non-contrast
Comparison: Brain MRI 12/05/2007.

CLINICAL DATA: Headache.  Head trauma.  Dizziness and vomiting.

CT HEAD WITHOUT CONTRAST
TECHNIQUE: Contiguous axial images were obtained from the base of
the skull through the vertex without contrast.

[Series 3: head bone · axial · 0.45mm/px · z∈[+8,+119]mm · 8 of 56 slices shown]
[im 6/56  bone]
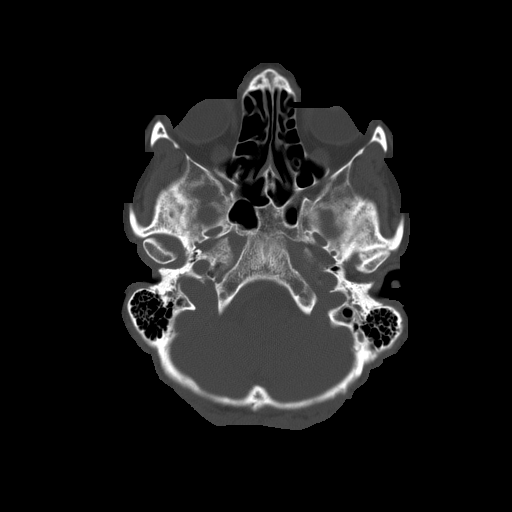
[im 12/56  bone]
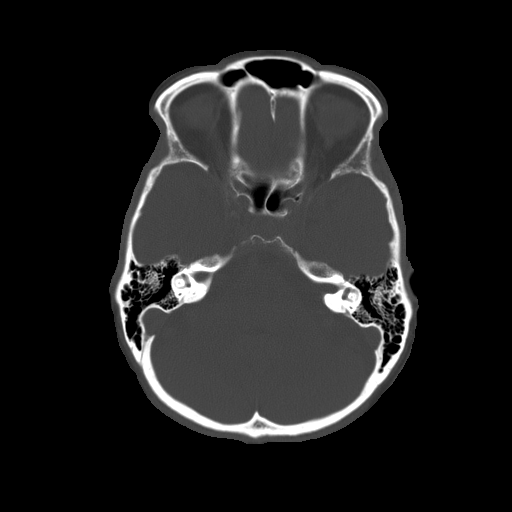
[im 18/56  bone]
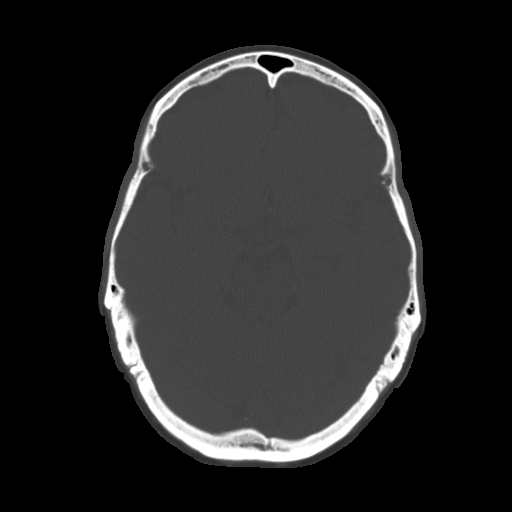
[im 24/56  bone]
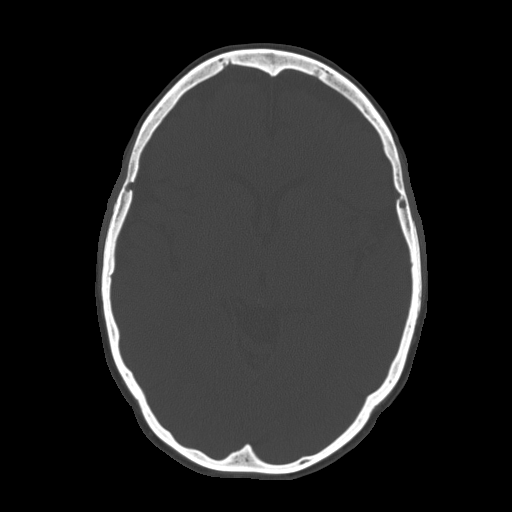
[im 32/56  bone]
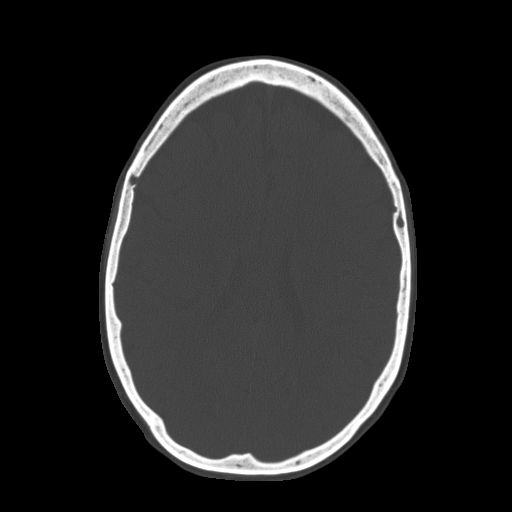
[im 38/56  bone]
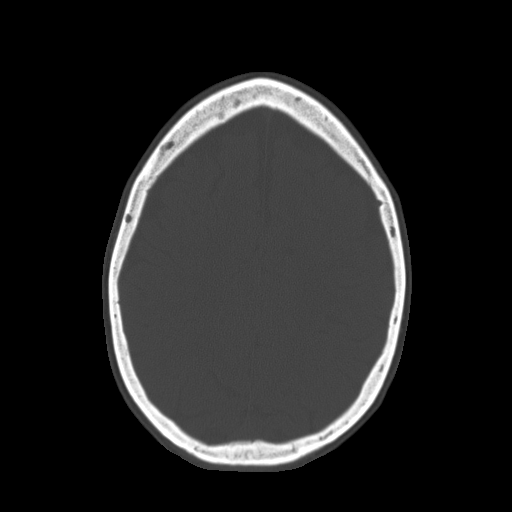
[im 44/56  bone]
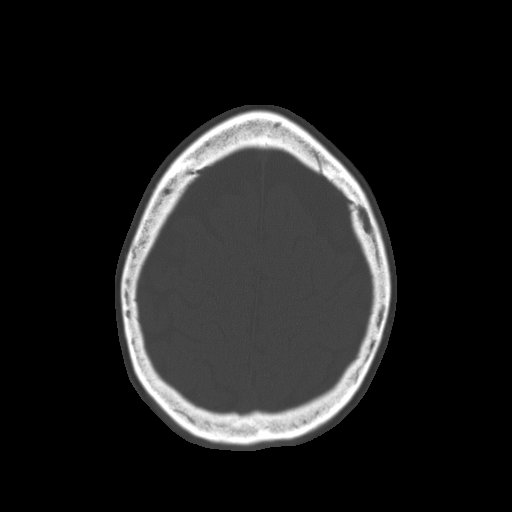
[im 50/56  bone]
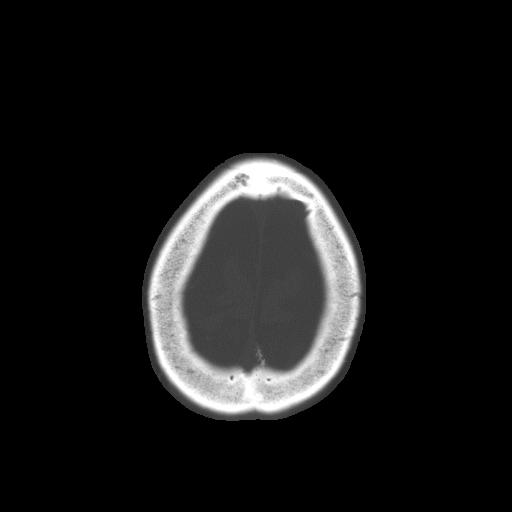

[Series 32: 3d filtered head w/o · axial · non-contrast · 0.45mm/px · z∈[+12,+117]mm · 8 of 28 slices shown, 10 images]
[im 4/28  brain]
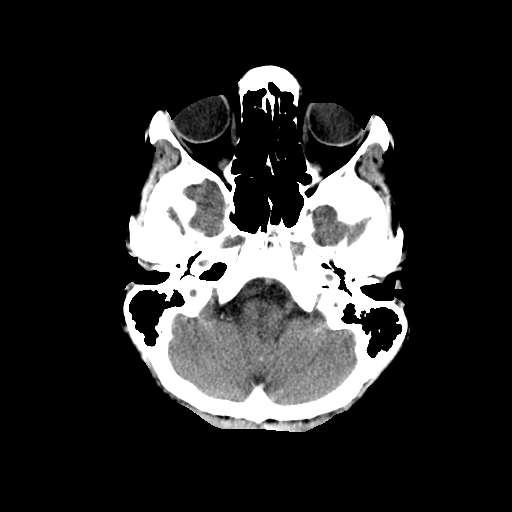
[im 4/28  bone]
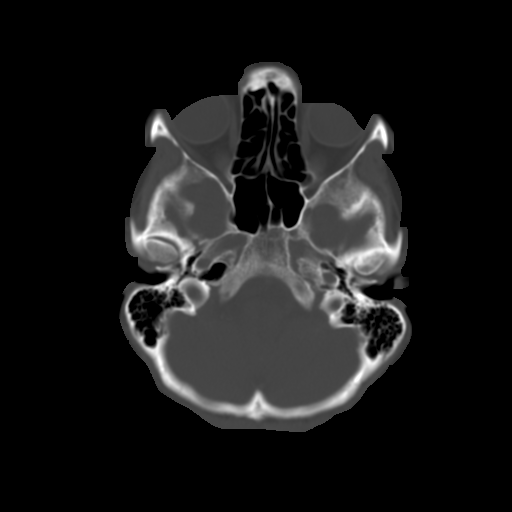
[im 7/28  brain]
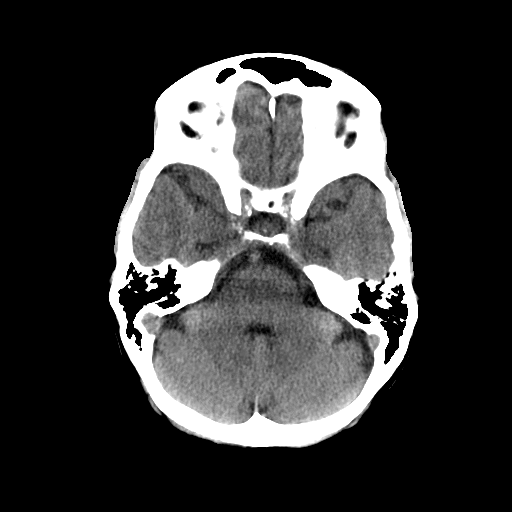
[im 10/28  brain]
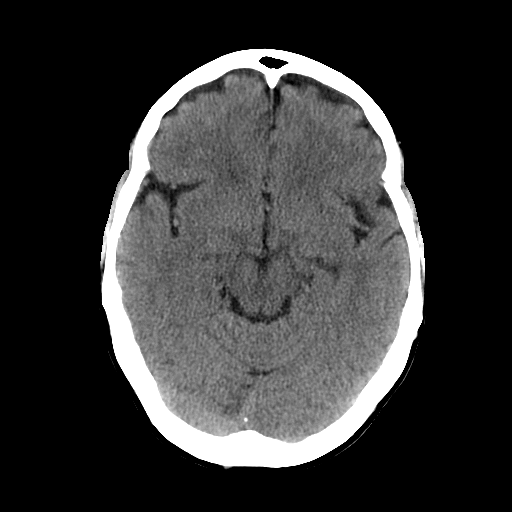
[im 13/28  brain]
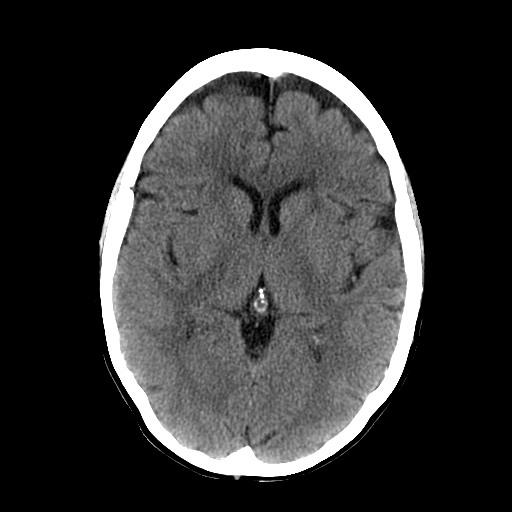
[im 16/28  brain]
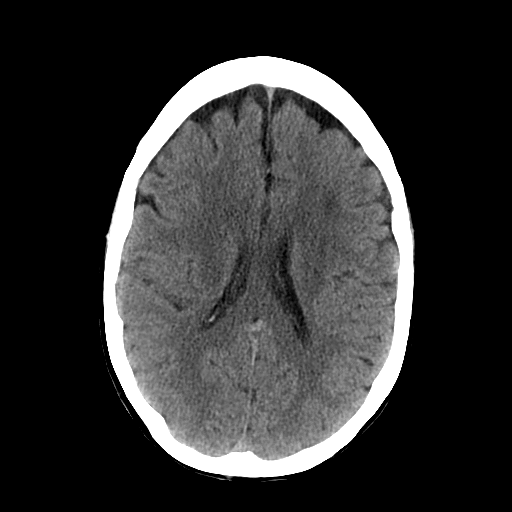
[im 16/28  bone]
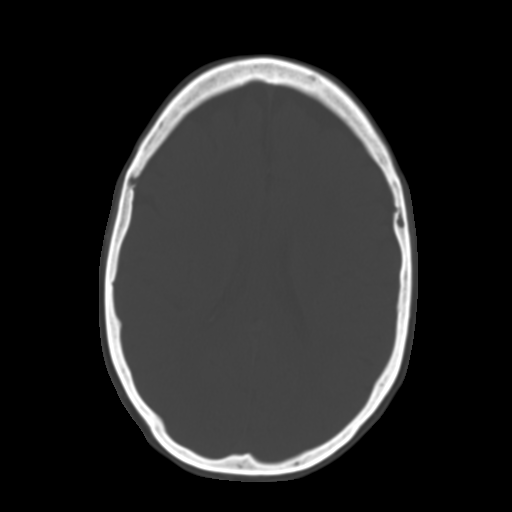
[im 19/28  brain]
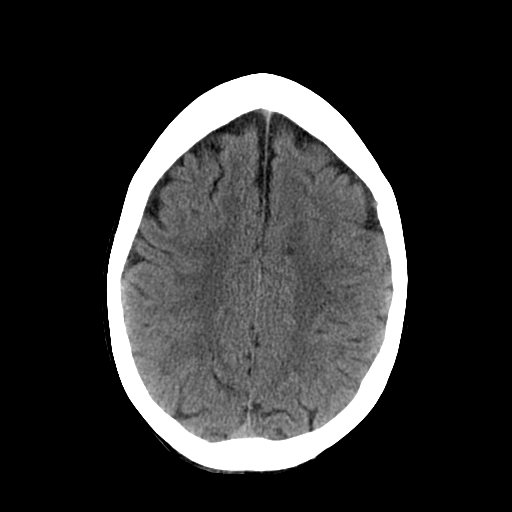
[im 22/28  brain]
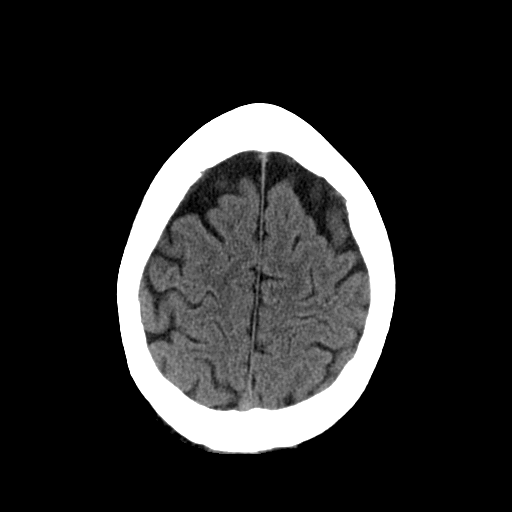
[im 25/28  brain]
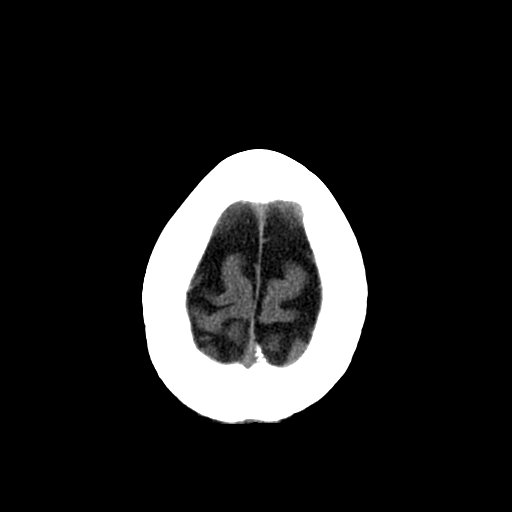

[16 of 30 positions shown; findings below may reference images not displayed]

FINDINGS: Mild cerebral and cerebellar atrophy.  Extensive patchy
and confluent areas of decreased attenuation throughout the deep
and periventricular white matter of the cerebral hemispheres
bilaterally, compatible with chronic microvascular ischemic
disease. No acute displaced skull fractures are identified.  No
acute intracranial abnormality.  Specifically, no evidence of acute
post-traumatic intracranial hemorrhage, no definite regions of
acute/subacute cerebral ischemia, no focal mass, mass effect,
hydrocephalus or abnormal intra or extra-axial fluid collections.
The visualized paranasal sinuses and mastoids are well pneumatized.
IMPRESSION: 1.  No acute displaced skull fractures or findings to suggest
significant acute traumatic injury to the brain.
2.  Mild cerebral and cerebellar atrophy with chronic microvascular
ischemic changes in the deep periventricular white matter of the
cerebral hemispheres bilaterally.

## 2014-09-29 IMAGING — CT CT HEAD W/O CM
2 series · 15 of 30 positions shown, 19 images · non-contrast
Comparison: 09/17/2012

CLINICAL DATA: Intermittent headaches since fall in [REDACTED].
Breast cancer 9 years ago.

EXAM:
CT HEAD WITHOUT CONTRAST
TECHNIQUE: Contiguous axial images were obtained from the base of the skull
through the vertex without intravenous contrast.

[Series 3: head bone · axial · 0.49mm/px · z∈[+28,+49]mm · 2 of 28 slices shown]
[im 2/28  bone]
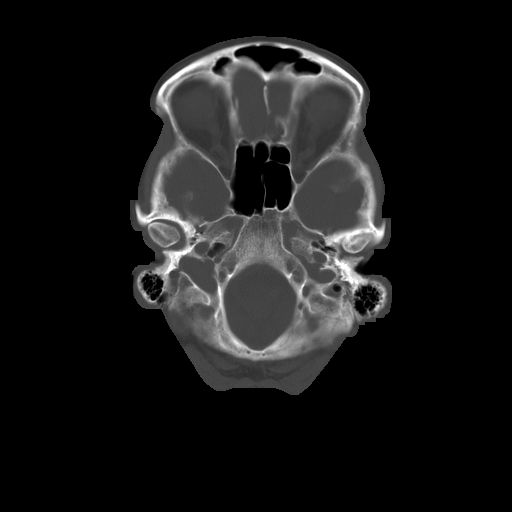
[im 6/28  bone]
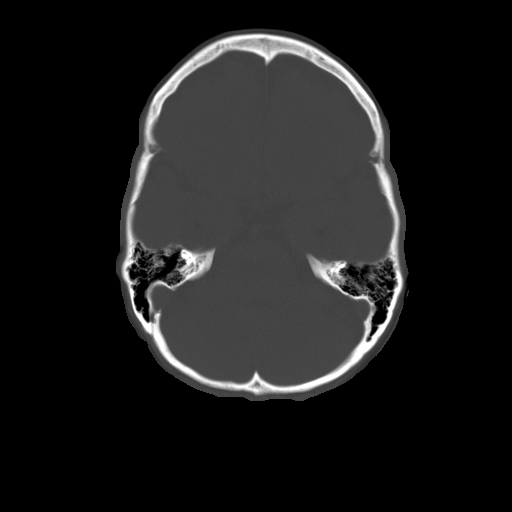

[Series 32: 3d filtered head w/o · axial · non-contrast · 0.49mm/px · z∈[+28,+151]mm · 13 of 28 slices shown, 17 images]
[im 2/28  brain]
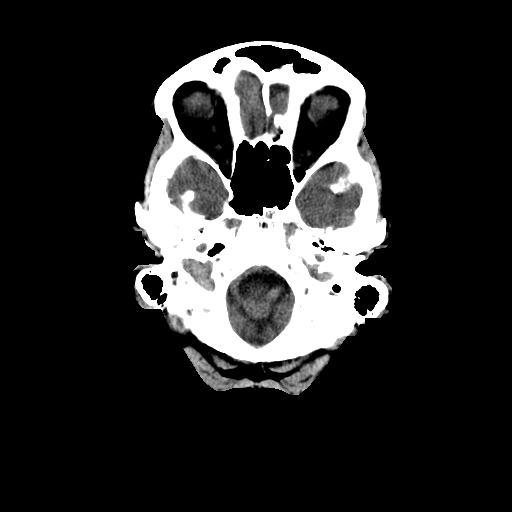
[im 2/28  bone]
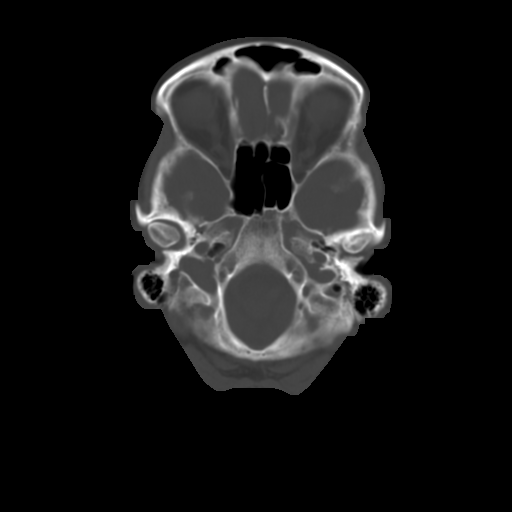
[im 4/28  brain]
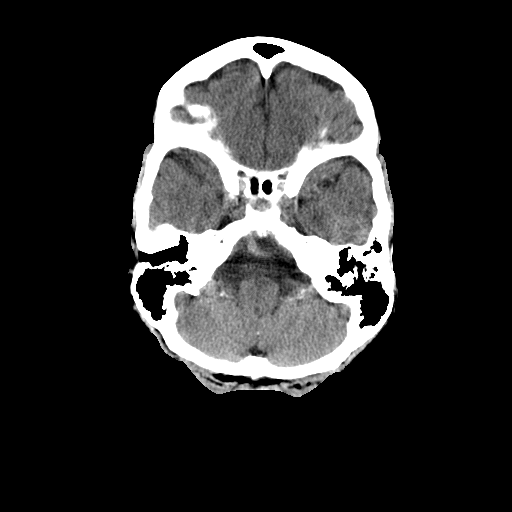
[im 6/28  brain]
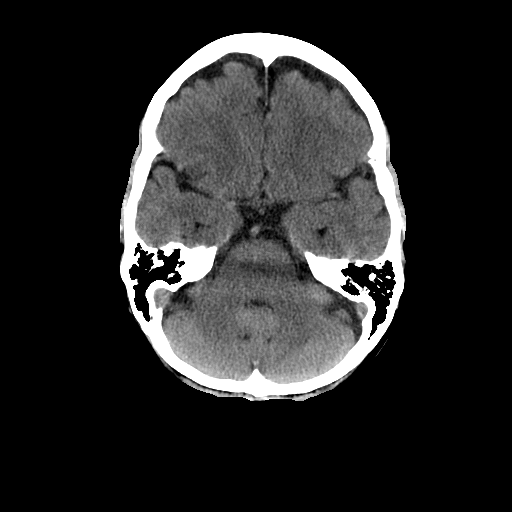
[im 8/28  brain]
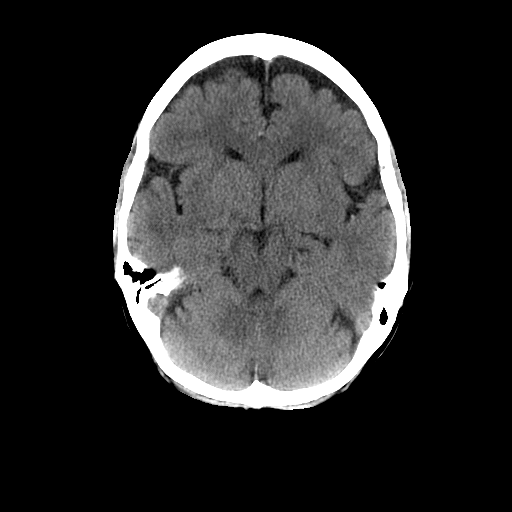
[im 10/28  brain]
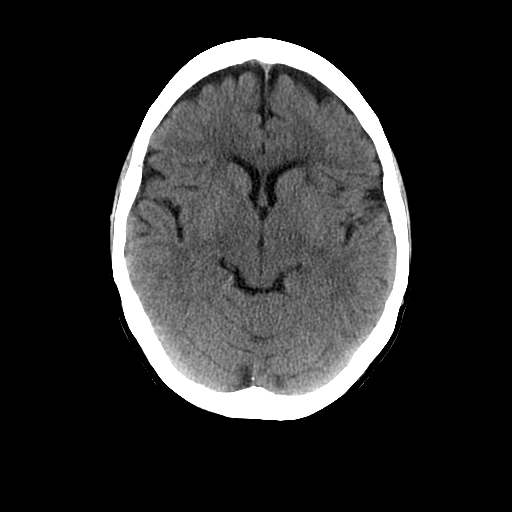
[im 10/28  bone]
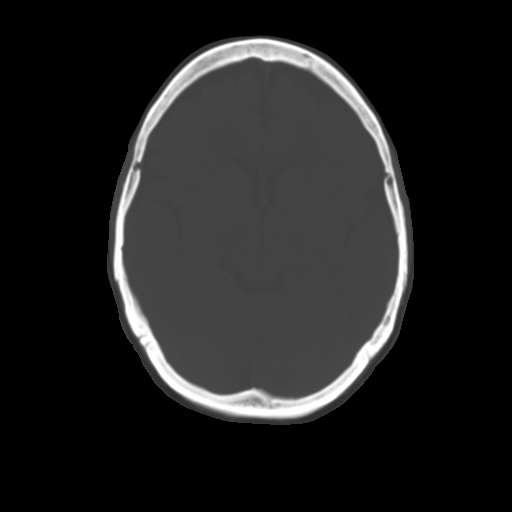
[im 12/28  brain]
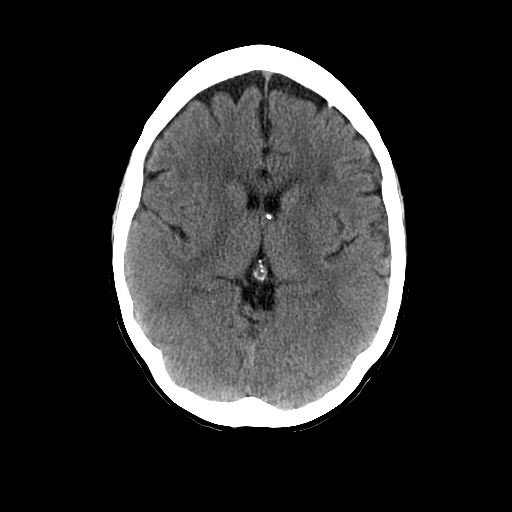
[im 14/28  brain]
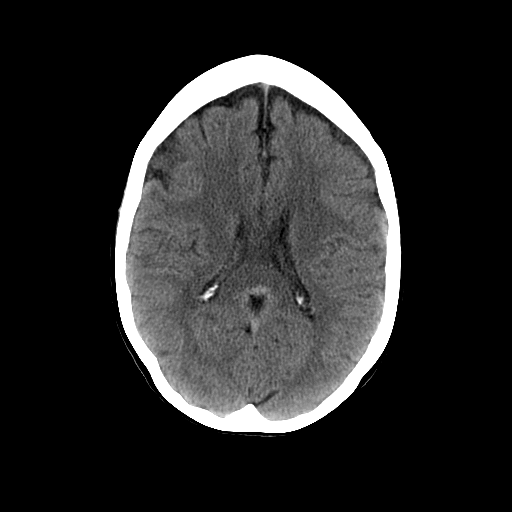
[im 16/28  brain]
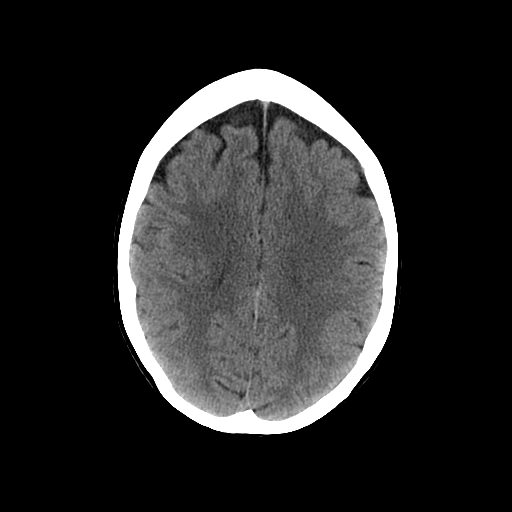
[im 18/28  brain]
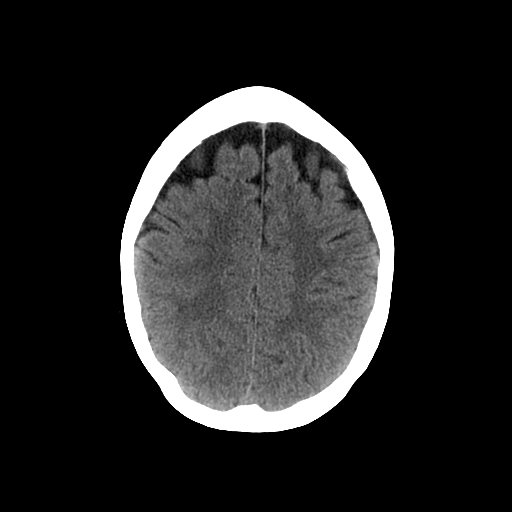
[im 18/28  bone]
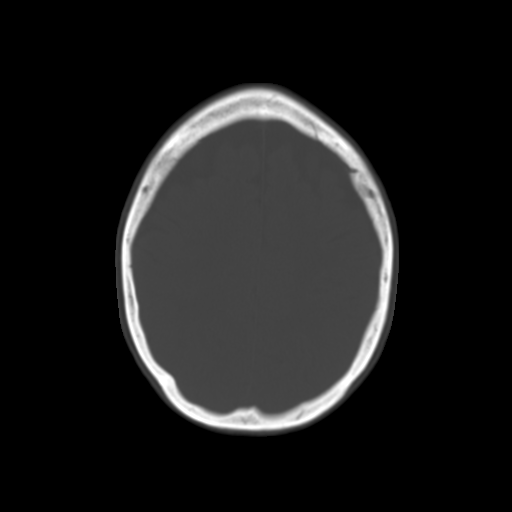
[im 20/28  brain]
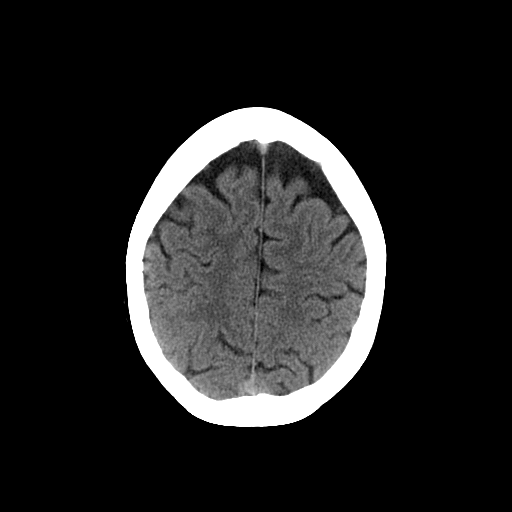
[im 22/28  brain]
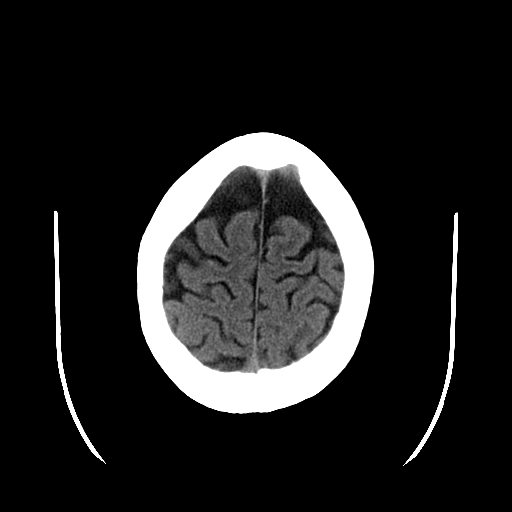
[im 24/28  brain]
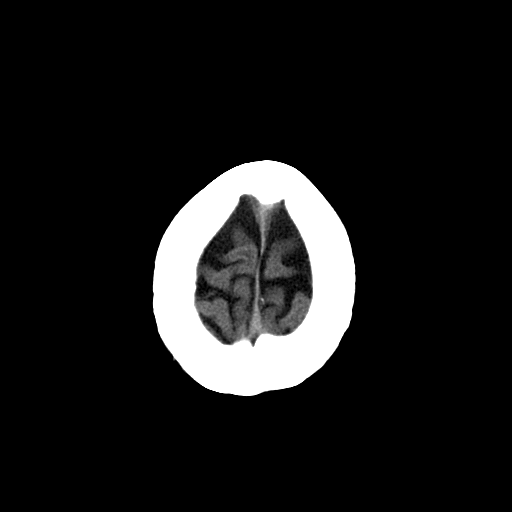
[im 26/28  brain]
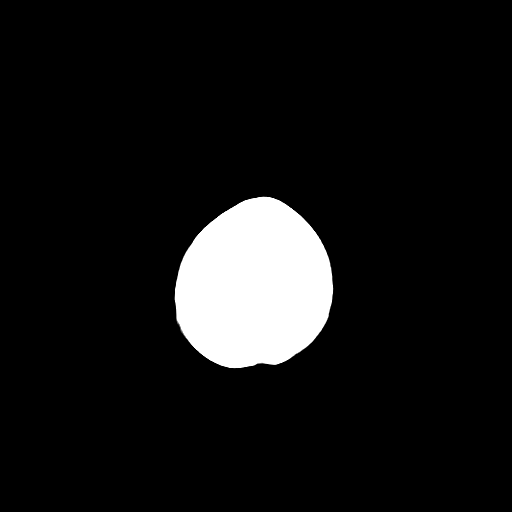
[im 26/28  bone]
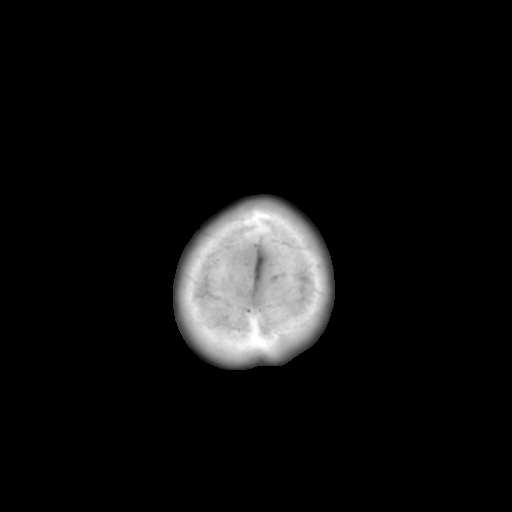

[15 of 30 positions shown; findings below may reference images not displayed]

FINDINGS: Sinuses/Soft tissues: No significant soft tissue swelling. Normal
imaged paranasal sinuses and mastoid air cells.

Intracranial: Mildly advanced cerebral atrophy. This results in mild
prominence of the extra-axial spaces, especially adjacent the
frontal lobes. Mild cerebellar atrophy is also not significantly
changed. Carotid and vertebral atherosclerosis. No mass lesion,
hemorrhage, hydrocephalus, acute infarct, intra-axial, or
extra-axial fluid collection. Mild small vessel ischemic change
suspected in the bifrontal periventricular white matter.
IMPRESSION: 1.  No acute intracranial abnormality.
2. Cerebral and cerebellar atrophy with resultant similar prominence
of the extra-axial spaces adjacent the frontal lobes.
3. Minimal small vessel ischemic change.

## 2015-08-27 ENCOUNTER — Encounter: Payer: Self-pay | Admitting: Gastroenterology

## 2015-10-15 ENCOUNTER — Encounter (INDEPENDENT_AMBULATORY_CARE_PROVIDER_SITE_OTHER): Payer: Medicare Other | Admitting: Ophthalmology

## 2015-10-15 DIAGNOSIS — H353132 Nonexudative age-related macular degeneration, bilateral, intermediate dry stage: Secondary | ICD-10-CM | POA: Diagnosis not present

## 2015-10-15 DIAGNOSIS — H43813 Vitreous degeneration, bilateral: Secondary | ICD-10-CM

## 2015-10-15 DIAGNOSIS — H2513 Age-related nuclear cataract, bilateral: Secondary | ICD-10-CM

## 2016-08-19 ENCOUNTER — Encounter (HOSPITAL_COMMUNITY): Payer: Self-pay

## 2016-08-19 ENCOUNTER — Ambulatory Visit (HOSPITAL_COMMUNITY)
Admission: EM | Admit: 2016-08-19 | Discharge: 2016-08-19 | Disposition: A | Discharge status: Home / Self Care | Payer: Medicare Other | Attending: Family Medicine | Admitting: Family Medicine

## 2016-08-19 DIAGNOSIS — R300 Dysuria: Secondary | ICD-10-CM | POA: Diagnosis not present

## 2016-08-19 DIAGNOSIS — N390 Urinary tract infection, site not specified: Secondary | ICD-10-CM | POA: Diagnosis not present

## 2016-08-19 LAB — POCT URINALYSIS DIP (DEVICE)
Bilirubin Urine: NEGATIVE
Glucose, UA: 100 mg/dL — AB
Ketones, ur: 15 mg/dL — AB
Nitrite: NEGATIVE
Protein, ur: 30 mg/dL — AB
Specific Gravity, Urine: 1.015 (ref 1.005–1.030)
Urobilinogen, UA: 1 mg/dL (ref 0.0–1.0)
pH: 8.5 — ABNORMAL HIGH (ref 5.0–8.0)

## 2016-08-19 MED ORDER — CEPHALEXIN 500 MG PO CAPS: 500.0000 mg | ORAL_CAPSULE | Freq: Four times a day (QID) | ORAL | 0 refills | Status: DC

## 2016-08-19 NOTE — ED Provider Notes (Signed)
CSN: [id number]     Arrival date & time 08/19/16  1017 History   None    Chief Complaint  Patient presents with  . Urinary Tract Infection   (Consider location/radiation/quality/duration/timing/severity/associated sxs/prior Treatment) Patient having dysuria x 2 days.   The history is provided by the patient.  Urinary Tract Infection  Pain quality:  Aching and burning Pain severity:  Moderate Onset quality:  Sudden Duration:  2 days Timing:  Constant Progression:  Worsening Chronicity:  New Recent urinary tract infections: no   Relieved by:  Nothing Worsened by:  Nothing Ineffective treatments:  Phenazopyridine   Past Medical History:  Diagnosis Date  . HA (headache)   . Hypothyroidism    Past Surgical History:  Procedure Laterality Date  . BREAST LUMPECTOMY    . MEDIAL PARTIAL KNEE REPLACEMENT    . THYROIDECTOMY    . TIBIA FRACTURE SURGERY     Family History  Problem Relation Age of Onset  . Ataxia Neg Hx   . Chorea Neg Hx   . Dementia Neg Hx   . Mental retardation Neg Hx   . Migraines Neg Hx   . Multiple sclerosis Neg Hx   . Neurofibromatosis Neg Hx   . Neuropathy Neg Hx   . Parkinsonism Neg Hx   . Seizures Neg Hx   . Stroke Neg Hx    Social History  Substance Use Topics  . Smoking status: Never Smoker  . Smokeless tobacco: Never Used  . Alcohol use No   OB History    No data available     Review of Systems  Constitutional: Negative.   HENT: Negative.   Eyes: Negative.   Respiratory: Negative.   Cardiovascular: Negative.   Gastrointestinal: Negative.   Endocrine: Negative.   Genitourinary: Positive for dysuria and frequency.  Musculoskeletal: Negative.   Skin: Negative.   Allergic/Immunologic: Negative.   Neurological: Negative.   Hematological: Negative.   Psychiatric/Behavioral: Negative.     Allergies  Patient has no known allergies.  Home Medications   Prior to Admission medications   Medication Sig Start Date End Date Taking?  Authorizing Provider  Ascorbic Acid (VITAMIN C) 1000 MG tablet Take 1,000 mg by mouth daily.   Yes Historical Provider, MD  aspirin 81 MG tablet Take 81 mg by mouth daily.   Yes Historical Provider, MD  fish oil-omega-3 fatty acids 1000 MG capsule Take 2 g by mouth daily.   Yes Historical Provider, MD  glucosamine-chondroitin 500-400 MG tablet Take 1 tablet by mouth 3 (three) times daily.   Yes Historical Provider, MD  levothyroxine (SYNTHROID, LEVOTHROID) 125 MCG tablet  04/20/13  Yes Historical Provider, MD  multivitamin-iron-minerals-folic acid (CENTRUM) chewable tablet Chew 1 tablet by mouth daily.   Yes Historical Provider, MD  pravastatin (PRAVACHOL) 20 MG tablet  04/20/13  Yes Historical Provider, MD  traMADol (ULTRAM) 50 MG tablet Take 50 mg by mouth every 6 (six) hours as needed for pain.   Yes Historical Provider, MD  Vitamin D, Ergocalciferol, (DRISDOL) 50000 UNITS CAPS capsule Take 50,000 Units by mouth.   Yes Historical Provider, MD  cephALEXin (KEFLEX) 500 MG capsule Take 1 capsule (500 mg total) by mouth 4 (four) times daily. 08/19/16   Lysbeth Penner, FNP   Meds Ordered and Administered this Visit  Medications - No data to display  BP 145/75 (BP Location: Right Arm)   Pulse 79   Temp 98.2 F (36.8 C) (Oral)   Resp 16   SpO2 96%  No data found.   Physical Exam  Constitutional: She is oriented to person, place, and time. She appears well-developed and well-nourished.  HENT:  Head: Normocephalic and atraumatic.  Eyes: Conjunctivae and EOM are normal. Pupils are equal, round, and reactive to light.  Neck: Normal range of motion. Neck supple.  Cardiovascular: Normal rate, regular rhythm and normal heart sounds.   Pulmonary/Chest: Effort normal and breath sounds normal.  Abdominal: Soft. Bowel sounds are normal.  Neurological: She is alert and oriented to person, place, and time.  Nursing note and vitals reviewed.   Urgent Care Course     Procedures (including  critical care time)  Labs Review Labs Reviewed  POCT URINALYSIS DIP (DEVICE) - Abnormal; Notable for the following:       Result Value   Glucose, UA 100 (*)    Ketones, ur 15 (*)    Hgb urine dipstick MODERATE (*)    pH 8.5 (*)    Protein, ur 30 (*)    Leukocytes, UA LARGE (*)    All other components within normal limits  URINE CULTURE    Imaging Review No results found.   Visual Acuity Review  Right Eye Distance:   Left Eye Distance:   Bilateral Distance:    Right Eye Near:   Left Eye Near:    Bilateral Near:         MDM   1. Lower urinary tract infectious disease   2. Dysuria    Keflex 500mg  one po qid x 7 days #28 Continue AZO  UA cx     Lysbeth Penner, FNP 08/19/16 1240

## 2016-08-19 NOTE — ED Triage Notes (Signed)
Pt having uti symptoms since yesterday. Having dysuria, frequency and fullness. Taking azo, cranberry pills and it's not helping. No fever.

## 2016-08-20 LAB — URINE CULTURE

## 2016-10-20 ENCOUNTER — Ambulatory Visit (INDEPENDENT_AMBULATORY_CARE_PROVIDER_SITE_OTHER): Payer: Medicare Other | Admitting: Ophthalmology

## 2016-10-20 DIAGNOSIS — H353122 Nonexudative age-related macular degeneration, left eye, intermediate dry stage: Secondary | ICD-10-CM | POA: Diagnosis not present

## 2016-10-20 DIAGNOSIS — H2513 Age-related nuclear cataract, bilateral: Secondary | ICD-10-CM | POA: Diagnosis not present

## 2016-10-20 DIAGNOSIS — H353111 Nonexudative age-related macular degeneration, right eye, early dry stage: Secondary | ICD-10-CM | POA: Diagnosis not present

## 2016-10-20 DIAGNOSIS — H43813 Vitreous degeneration, bilateral: Secondary | ICD-10-CM

## 2016-10-27 ENCOUNTER — Encounter: Payer: Self-pay | Admitting: Gastroenterology

## 2016-11-21 ENCOUNTER — Encounter: Payer: Self-pay | Admitting: Internal Medicine

## 2016-11-21 ENCOUNTER — Telehealth: Payer: Self-pay | Admitting: Internal Medicine

## 2016-11-21 NOTE — Telephone Encounter (Signed)
I accept 

## 2016-11-21 NOTE — Telephone Encounter (Signed)
Called and left message for patient to call back to schedule with Dr. Carlean Purl

## 2017-01-06 ENCOUNTER — Encounter: Payer: Self-pay | Admitting: Internal Medicine

## 2017-01-06 ENCOUNTER — Ambulatory Visit (AMBULATORY_SURGERY_CENTER): Payer: Self-pay

## 2017-01-06 VITALS — Ht 62.5 in | Wt 142.8 lb

## 2017-01-06 DIAGNOSIS — Z1211 Encounter for screening for malignant neoplasm of colon: Secondary | ICD-10-CM

## 2017-01-06 NOTE — Progress Notes (Signed)
No allergies to eggs or soy No diet meds No home oxygen No past problems with anesthesia  Registered emmi 

## 2017-01-20 ENCOUNTER — Ambulatory Visit (AMBULATORY_SURGERY_CENTER): Payer: Medicare Other | Admitting: Internal Medicine

## 2017-01-20 ENCOUNTER — Encounter: Payer: Self-pay | Admitting: Internal Medicine

## 2017-01-20 VITALS — BP 116/71 | HR 72 | Temp 98.0°F | Resp 17 | Ht 62.0 in | Wt 142.0 lb

## 2017-01-20 DIAGNOSIS — Z1211 Encounter for screening for malignant neoplasm of colon: Secondary | ICD-10-CM

## 2017-01-20 DIAGNOSIS — Z1212 Encounter for screening for malignant neoplasm of rectum: Secondary | ICD-10-CM

## 2017-01-20 DIAGNOSIS — D120 Benign neoplasm of cecum: Secondary | ICD-10-CM

## 2017-01-20 MED ORDER — SODIUM CHLORIDE 0.9 % IV SOLN: 500.0000 mL | INTRAVENOUS | Status: AC

## 2017-01-20 NOTE — Progress Notes (Signed)
Called to room to assist during endoscopic procedure.  Patient ID and intended procedure confirmed with present staff. Received instructions for my participation in the procedure from the performing physician.  

## 2017-01-20 NOTE — Patient Instructions (Addendum)
I found and removed one tiny polyp. It looks benign - I will let you know but I do not think I will recommend another colonoscopy on a routine basis.  You also have a condition called diverticulosis - common and not usually a problem. Please read the handout provided.  I appreciate the opportunity to care for you. Gatha Mayer, MD, [facility]  Polyp handout given to patient. Diverticulosis handout given patient.  YOU HAD AN ENDOSCOPIC PROCEDURE TODAY AT [facility]:   Refer to the procedure report that was given to you for any specific questions about what was found during the examination.  If the procedure report does not answer your questions, please call your gastroenterologist to clarify.  If you requested that your care partner not be given the details of your procedure findings, then the procedure report has been included in a sealed envelope for you to review at your convenience later.  YOU SHOULD EXPECT: Some feelings of bloating in the abdomen. Passage of more gas than usual.  Walking can help get rid of the air that was put into your GI tract during the procedure and reduce the bloating. If you had a lower endoscopy (such as a colonoscopy or flexible sigmoidoscopy) you may notice spotting of blood in your stool or on the toilet paper. If you underwent a bowel prep for your procedure, you may not have a normal bowel movement for a few days.  Please Note:  You might notice some irritation and congestion in your nose or some drainage.  This is from the oxygen used during your procedure.  There is no need for concern and it should clear up in a day or so.  SYMPTOMS TO REPORT IMMEDIATELY:   Following lower endoscopy (colonoscopy or flexible sigmoidoscopy):  Excessive amounts of blood in the stool  Significant tenderness or worsening of abdominal pains  Swelling of the abdomen that is new, acute  Fever of 100F or higher For urgent or emergent issues, a  gastroenterologist can be reached at any hour by calling [phone].   DIET:  We do recommend a small meal at first, but then you may proceed to your regular diet.  Drink plenty of fluids but you should avoid alcoholic beverages for 24 hours.  ACTIVITY:  You should plan to take it easy for the rest of today and you should NOT DRIVE or use heavy machinery until tomorrow (because of the sedation medicines used during the test).    FOLLOW UP: Our staff will call the number listed on your records the next business day following your procedure to check on you and address any questions or concerns that you may have regarding the information given to you following your procedure. If we do not reach you, we will leave a message.  However, if you are feeling well and you are not experiencing any problems, there is no need to return our call.  We will assume that you have returned to your regular daily activities without incident.  If any biopsies were taken you will be contacted by phone or by letter within the next 1-3 weeks.  Please call us at [phone] if you have not heard about the biopsies in 3 weeks.    SIGNATURES/CONFIDENTIALITY: You and/or your care partner have signed paperwork which will be entered into your electronic medical record.  These signatures attest to the fact that that the information above on your After Visit Summary has been reviewed and  is understood.  Full responsibility of the confidentiality of this discharge information lies with you and/or your care-partner.

## 2017-01-20 NOTE — Progress Notes (Signed)
Pt's states no medical or surgical changes since previsit or office visit. 

## 2017-01-20 NOTE — Progress Notes (Signed)
To PACU VSS. Report to [email or web address] 

## 2017-01-20 NOTE — Op Note (Signed)
[place] Patient Name: Amy Clements Procedure Date: 01/20/2017 10:22 AM MRN: [id number] Endoscopist: Gatha Mayer , MD Age: 76 Referring MD:  Date of Birth: 1940-12-28 Gender: Female Account #: [id number] Procedure:                Colonoscopy Indications:              Screening for colorectal malignant neoplasm, Last                            colonoscopy: 2008 Medicines:                Propofol per Anesthesia, Monitored Anesthesia Care Procedure:                Pre-Anesthesia Assessment:                           - Prior to the procedure, a History and Physical                            was performed, and patient medications and                            allergies were reviewed. The patient's tolerance of                            previous anesthesia was also reviewed. The risks                            and benefits of the procedure and the sedation                            options and risks were discussed with the patient.                            All questions were answered, and informed consent                            was obtained. Prior Anticoagulants: The patient has                            taken no previous anticoagulant or antiplatelet                            agents. ASA Grade Assessment: II - A patient with                            mild systemic disease. After reviewing the risks                            and benefits, the patient was deemed in                            satisfactory condition to undergo the procedure.  After obtaining informed consent, the colonoscope                            was passed under direct vision. Throughout the                            procedure, the patient's blood pressure, pulse, and                            oxygen saturations were monitored continuously. The                            Colonoscope was introduced through the anus and                            advanced to the  the cecum, identified by                            appendiceal orifice and ileocecal valve. The                            colonoscopy was performed without difficulty. The                            patient tolerated the procedure well. The quality                            of the bowel preparation was excellent. The                            ileocecal valve, appendiceal orifice, and rectum                            were photographed. The bowel preparation used was                            Miralax. Scope In: 10:32:07 AM Scope Out: 10:48:48 AM Scope Withdrawal Time: 0 hours 12 minutes 51 seconds  Total Procedure Duration: 0 hours 16 minutes 41 seconds  Findings:                 The perianal and digital rectal examinations were                            normal.                           The perianal and digital rectal examinations were                            normal.                           A 3 mm polyp was found in the cecum. The polyp was  sessile. The polyp was removed with a cold snare.                            Resection and retrieval were complete. Verification                            of patient identification for the specimen was                            done. Estimated blood loss was minimal.                           Multiple large-mouthed diverticula were found in                            the sigmoid colon.                           The exam was otherwise without abnormality on                            direct and retroflexion views. Complications:            No immediate complications. Estimated Blood Loss:     Estimated blood loss was minimal. Impression:               - One 3 mm polyp in the cecum, removed with a cold                            snare. Resected and retrieved.                           - Diverticulosis in the sigmoid colon.                           - The examination was otherwise normal on direct                             and retroflexion views. Recommendation:           - Patient has a contact number available for                            emergencies. The signs and symptoms of potential                            delayed complications were discussed with the                            patient. Return to normal activities tomorrow.                            Written discharge instructions were provided to the                            patient.                           -  Resume previous diet.                           - Continue present medications.                           - No repeat colonoscopy due to age. Gatha Mayer, MD 01/20/2017 10:57:36 AM This report has been signed electronically.

## 2017-01-23 ENCOUNTER — Telehealth: Payer: Self-pay | Admitting: *Deleted

## 2017-01-23 NOTE — Telephone Encounter (Signed)
  Follow up Call-  Call back number 01/20/2017  Post procedure Call Back phone  # [phone]  Permission to leave phone message Yes  Some recent data might be hidden     Patient questions:  Do you have a fever, pain , or abdominal swelling? No. Pain Score  0 *  Have you tolerated food without any problems? Yes.    Have you been able to return to your normal activities? Yes.    Do you have any questions about your discharge instructions: Diet   No. Medications  No. Follow up visit  No.  Do you have questions or concerns about your Care? No.  Actions: * If pain score is 4 or above: No action needed, pain <4.

## 2017-01-27 ENCOUNTER — Encounter: Payer: Self-pay | Admitting: Internal Medicine

## 2017-01-27 NOTE — Progress Notes (Signed)
3 mm adenoma No recall - age

## 2017-04-24 ENCOUNTER — Encounter: Payer: Self-pay | Admitting: Neurology

## 2017-05-08 DIAGNOSIS — R519 Headache, unspecified: Secondary | ICD-10-CM | POA: Insufficient documentation

## 2017-06-30 ENCOUNTER — Ambulatory Visit: Payer: Medicare Other | Admitting: Neurology

## 2017-10-20 ENCOUNTER — Ambulatory Visit (INDEPENDENT_AMBULATORY_CARE_PROVIDER_SITE_OTHER): Payer: Medicare Other | Admitting: Ophthalmology

## 2017-11-03 ENCOUNTER — Ambulatory Visit (INDEPENDENT_AMBULATORY_CARE_PROVIDER_SITE_OTHER): Payer: Medicare Other | Admitting: Ophthalmology

## 2017-11-03 DIAGNOSIS — H353132 Nonexudative age-related macular degeneration, bilateral, intermediate dry stage: Secondary | ICD-10-CM | POA: Diagnosis not present

## 2017-11-03 DIAGNOSIS — H43813 Vitreous degeneration, bilateral: Secondary | ICD-10-CM | POA: Diagnosis not present

## 2018-11-14 ENCOUNTER — Encounter (INDEPENDENT_AMBULATORY_CARE_PROVIDER_SITE_OTHER): Payer: Medicare Other | Admitting: Ophthalmology

## 2019-01-15 ENCOUNTER — Encounter (INDEPENDENT_AMBULATORY_CARE_PROVIDER_SITE_OTHER): Payer: Medicare Other | Admitting: Ophthalmology

## 2019-01-15 ENCOUNTER — Other Ambulatory Visit: Payer: Self-pay

## 2019-01-15 DIAGNOSIS — H353132 Nonexudative age-related macular degeneration, bilateral, intermediate dry stage: Secondary | ICD-10-CM

## 2019-01-15 DIAGNOSIS — H43813 Vitreous degeneration, bilateral: Secondary | ICD-10-CM

## 2019-12-13 DIAGNOSIS — Z6823 Body mass index (BMI) 23.0-23.9, adult: Secondary | ICD-10-CM | POA: Diagnosis not present

## 2019-12-13 DIAGNOSIS — J019 Acute sinusitis, unspecified: Secondary | ICD-10-CM | POA: Diagnosis not present

## 2020-01-13 ENCOUNTER — Encounter (INDEPENDENT_AMBULATORY_CARE_PROVIDER_SITE_OTHER): Payer: Self-pay | Admitting: Ophthalmology

## 2020-01-20 DIAGNOSIS — Z85828 Personal history of other malignant neoplasm of skin: Secondary | ICD-10-CM | POA: Diagnosis not present

## 2020-01-20 DIAGNOSIS — L820 Inflamed seborrheic keratosis: Secondary | ICD-10-CM | POA: Diagnosis not present

## 2020-01-20 DIAGNOSIS — D1801 Hemangioma of skin and subcutaneous tissue: Secondary | ICD-10-CM | POA: Diagnosis not present

## 2020-01-20 DIAGNOSIS — D225 Melanocytic nevi of trunk: Secondary | ICD-10-CM | POA: Diagnosis not present

## 2020-01-20 DIAGNOSIS — L814 Other melanin hyperpigmentation: Secondary | ICD-10-CM | POA: Diagnosis not present

## 2020-01-20 DIAGNOSIS — L905 Scar conditions and fibrosis of skin: Secondary | ICD-10-CM | POA: Diagnosis not present

## 2020-01-20 DIAGNOSIS — L821 Other seborrheic keratosis: Secondary | ICD-10-CM | POA: Diagnosis not present

## 2020-03-10 DIAGNOSIS — E559 Vitamin D deficiency, unspecified: Secondary | ICD-10-CM | POA: Diagnosis not present

## 2020-03-10 DIAGNOSIS — Z6822 Body mass index (BMI) 22.0-22.9, adult: Secondary | ICD-10-CM | POA: Diagnosis not present

## 2020-03-10 DIAGNOSIS — E890 Postprocedural hypothyroidism: Secondary | ICD-10-CM | POA: Diagnosis not present

## 2020-03-10 DIAGNOSIS — E782 Mixed hyperlipidemia: Secondary | ICD-10-CM | POA: Diagnosis not present

## 2020-04-22 DIAGNOSIS — E890 Postprocedural hypothyroidism: Secondary | ICD-10-CM | POA: Diagnosis not present

## 2020-06-23 DIAGNOSIS — Z23 Encounter for immunization: Secondary | ICD-10-CM | POA: Diagnosis not present

## 2020-06-23 DIAGNOSIS — M7702 Medial epicondylitis, left elbow: Secondary | ICD-10-CM | POA: Diagnosis not present

## 2020-07-10 DIAGNOSIS — Z6822 Body mass index (BMI) 22.0-22.9, adult: Secondary | ICD-10-CM | POA: Diagnosis not present

## 2020-07-10 DIAGNOSIS — L309 Dermatitis, unspecified: Secondary | ICD-10-CM | POA: Diagnosis not present

## 2020-07-28 DIAGNOSIS — Z1231 Encounter for screening mammogram for malignant neoplasm of breast: Secondary | ICD-10-CM | POA: Diagnosis not present

## 2020-08-10 DIAGNOSIS — R928 Other abnormal and inconclusive findings on diagnostic imaging of breast: Secondary | ICD-10-CM | POA: Diagnosis not present

## 2020-08-10 DIAGNOSIS — R922 Inconclusive mammogram: Secondary | ICD-10-CM | POA: Diagnosis not present

## 2020-09-10 DIAGNOSIS — E890 Postprocedural hypothyroidism: Secondary | ICD-10-CM | POA: Diagnosis not present

## 2020-09-10 DIAGNOSIS — E559 Vitamin D deficiency, unspecified: Secondary | ICD-10-CM | POA: Diagnosis not present

## 2020-09-10 DIAGNOSIS — E2839 Other primary ovarian failure: Secondary | ICD-10-CM | POA: Diagnosis not present

## 2020-09-10 DIAGNOSIS — Z79899 Other long term (current) drug therapy: Secondary | ICD-10-CM | POA: Diagnosis not present

## 2020-09-10 DIAGNOSIS — E782 Mixed hyperlipidemia: Secondary | ICD-10-CM | POA: Diagnosis not present

## 2020-09-10 DIAGNOSIS — M810 Age-related osteoporosis without current pathological fracture: Secondary | ICD-10-CM | POA: Diagnosis not present

## 2020-09-10 DIAGNOSIS — Z6822 Body mass index (BMI) 22.0-22.9, adult: Secondary | ICD-10-CM | POA: Diagnosis not present

## 2020-10-09 DIAGNOSIS — M85851 Other specified disorders of bone density and structure, right thigh: Secondary | ICD-10-CM | POA: Diagnosis not present

## 2020-10-09 DIAGNOSIS — M810 Age-related osteoporosis without current pathological fracture: Secondary | ICD-10-CM | POA: Diagnosis not present

## 2020-10-09 DIAGNOSIS — Z780 Asymptomatic menopausal state: Secondary | ICD-10-CM | POA: Diagnosis not present

## 2021-03-11 DIAGNOSIS — Z9181 History of falling: Secondary | ICD-10-CM | POA: Diagnosis not present

## 2021-03-11 DIAGNOSIS — E890 Postprocedural hypothyroidism: Secondary | ICD-10-CM | POA: Diagnosis not present

## 2021-03-11 DIAGNOSIS — Z139 Encounter for screening, unspecified: Secondary | ICD-10-CM | POA: Diagnosis not present

## 2021-03-11 DIAGNOSIS — M810 Age-related osteoporosis without current pathological fracture: Secondary | ICD-10-CM | POA: Diagnosis not present

## 2021-03-11 DIAGNOSIS — E559 Vitamin D deficiency, unspecified: Secondary | ICD-10-CM | POA: Diagnosis not present

## 2021-03-17 DIAGNOSIS — Z6821 Body mass index (BMI) 21.0-21.9, adult: Secondary | ICD-10-CM | POA: Diagnosis not present

## 2021-03-17 DIAGNOSIS — R079 Chest pain, unspecified: Secondary | ICD-10-CM | POA: Diagnosis not present

## 2021-04-28 ENCOUNTER — Encounter: Payer: Self-pay | Admitting: Family Medicine

## 2021-04-29 DIAGNOSIS — Z0000 Encounter for general adult medical examination without abnormal findings: Secondary | ICD-10-CM | POA: Diagnosis not present

## 2021-04-29 DIAGNOSIS — Z1331 Encounter for screening for depression: Secondary | ICD-10-CM | POA: Diagnosis not present

## 2021-04-29 DIAGNOSIS — Z9181 History of falling: Secondary | ICD-10-CM | POA: Diagnosis not present

## 2021-04-29 DIAGNOSIS — E785 Hyperlipidemia, unspecified: Secondary | ICD-10-CM | POA: Diagnosis not present

## 2021-05-10 DIAGNOSIS — Z6821 Body mass index (BMI) 21.0-21.9, adult: Secondary | ICD-10-CM | POA: Diagnosis not present

## 2021-05-10 DIAGNOSIS — J069 Acute upper respiratory infection, unspecified: Secondary | ICD-10-CM | POA: Diagnosis not present

## 2021-05-10 DIAGNOSIS — Z20822 Contact with and (suspected) exposure to covid-19: Secondary | ICD-10-CM | POA: Diagnosis not present

## 2021-05-16 ENCOUNTER — Encounter: Payer: Self-pay | Admitting: Cardiology

## 2021-05-16 DIAGNOSIS — R072 Precordial pain: Secondary | ICD-10-CM | POA: Insufficient documentation

## 2021-05-16 NOTE — Progress Notes (Signed)
Cardiology Office Note   Date:  05/18/2021   ID:  Amy Clements, DOB Oct 25, 1940, MRN [id number]  PCP:  Leonides Sake, MD  Cardiologist:   None Referring:  Leonides Sake, MD  Chief Complaint  Patient presents with   Chest Pain       History of Present Illness: Amy Clements is a 80 y.o. female who is referred by Hamrick, Lorin Mercy, MD for evaluation of chest pain.  The patient has no past cardiac history.  She lives alone.  She mows her own lawn with a push mower.  With this she denies any cardiovascular symptoms.  She said that the discomfort she was having was a couple of months ago.  It happened at rest.  It was waxing and waning over 2 days.  It was 7 out of 10 in intensity.  It was midsternal and she described it as sharp or gripping.  It eventually went away and it was blamed potentially on musculoskeletal pain as she been working in the yard prior to that.  She never had this before and its not happening since.  Was not positional.  She was not having any cough fevers or chills.  She was not having any nausea vomiting or diaphoresis.  She has since felt well and has been back to her baseline.  She is here with her daughter today and says she has had a tough couple of years in the span of 6 months she lost her husband of 59 years and her oldest son died of cancer.   Past Medical History:  Diagnosis Date   Adenomatous colon polyp    Arthritis    Cancer ([place])    breast right, lumpectomy, radiation 2004-2005   HA (headache)    Hyperlipidemia    Hypothyroidism    Osteoporosis     Past Surgical History:  Procedure Laterality Date   BREAST LUMPECTOMY     MEDIAL PARTIAL KNEE REPLACEMENT     THYROIDECTOMY     TIBIA FRACTURE SURGERY     closed reduction; right     Current Outpatient Medications  Medication Sig Dispense Refill   acetaminophen (TYLENOL) 500 MG tablet Take 1,000 mg by mouth every 6 (six) hours as needed.     alendronate (FOSAMAX) 70 MG  tablet Take 70 mg by mouth once a week. Take with a full glass of water on an empty stomach.     levothyroxine (SYNTHROID, LEVOTHROID) 112 MCG tablet 112 mcg.      Current Facility-Administered Medications  Medication Dose Route Frequency Provider Last Rate Last Admin   0.9 %  sodium chloride infusion  500 mL Intravenous Continuous Gatha Mayer, MD        Allergies:   Neurontin [gabapentin]    Social History:  The patient  reports that she has never smoked. She has never used smokeless tobacco. She reports that she does not drink alcohol and does not use drugs.   Family History:  The patient's family history includes Heart disease (age of onset: 85) in her mother; Stroke in her daughter.    ROS:  Please see the history of present illness.   Otherwise, review of systems are positive for none.   All other systems are reviewed and negative.    PHYSICAL EXAM: VS:  BP (!) 130/91   Pulse 77   Ht 5\' 3"  (1.6 m)   Wt 126 lb (57.2 kg)   SpO2 93%   BMI 22.32  kg/m  , BMI Body mass index is 22.32 kg/m. GENERAL:  Well appearing HEENT:  Pupils equal round and reactive, fundi not visualized, oral mucosa unremarkable NECK:  No jugular venous distention, waveform within normal limits, carotid upstroke brisk and symmetric, no bruits, no thyromegaly LYMPHATICS:  No cervical, inguinal adenopathy LUNGS:  Clear to auscultation bilaterally BACK:  No CVA tenderness CHEST:  Unremarkable HEART:  PMI not displaced or sustained,S1 and S2 within normal limits, no S3, no S4, no clicks, no rubs, no murmurs ABD:  Flat, positive bowel sounds normal in frequency in pitch, no bruits, no rebound, no guarding, no midline pulsatile mass, no hepatomegaly, no splenomegaly EXT:  2 plus pulses throughout, no edema, no cyanosis no clubbing SKIN:  No rashes no nodules NEURO:  Cranial nerves II through XII grossly intact, motor grossly intact throughout PSYCH:  Cognitively intact, oriented to person place and  time    EKG:  EKG is ordered today. The ekg ordered today demonstrates sinus rhythm, rate 77, leftward axis, premature ventricular contractions, incomplete right bundle branch block, no acute ST-T wave changes.   Recent Labs: No results found for requested labs within last 8760 hours.    Lipid Panel    Component Value Date/Time   CHOL 164 06/07/2006 1038   TRIG 100 06/07/2006 1038   HDL 49.8 06/07/2006 1038   CHOLHDL 3.3 CALC 06/07/2006 1038   VLDL 20 06/07/2006 1038   LDLCALC 94 06/07/2006 1038      Wt Readings from Last 3 Encounters:  05/18/21 126 lb (57.2 kg)  01/20/17 142 lb (64.4 kg)  01/06/17 142 lb 12.8 oz (64.8 kg)      Other studies Reviewed: Additional studies/ records that were reviewed today include: Labs. Review of the above records demonstrates:  Please see elsewhere in the note.     ASSESSMENT AND PLAN:  Precordial chest pain: The patient's chest discomfort has resolved.  He had nonanginal greater than anginal features.  She is quite active physically.  She has minimal cardiovascular risk factors.  I think given this no further testing is indicated not had this conversation with the patient and her daughter and they agree.  Dyslipidemia: LDL was 134.  I agree with managing this conservatively without statin.  I discussed at length a plant-based Mediterranean style diet.   Current medicines are reviewed at length with the patient today.  The patient does not have concerns regarding medicines.  The following changes have been made:  no change  Labs/ tests ordered today include:   Orders Placed This Encounter  Procedures   EKG 12-Lead      Disposition:   FU with me needed as needed.   Signed, Minus Breeding, MD  05/18/2021 4:16 PM    [facility]

## 2021-05-18 ENCOUNTER — Other Ambulatory Visit: Payer: Self-pay

## 2021-05-18 ENCOUNTER — Encounter: Payer: Self-pay | Admitting: Cardiology

## 2021-05-18 ENCOUNTER — Ambulatory Visit: Payer: Medicare PPO | Admitting: Cardiology

## 2021-05-18 VITALS — BP 130/91 | HR 77 | Ht 63.0 in | Wt 126.0 lb

## 2021-05-18 DIAGNOSIS — R072 Precordial pain: Secondary | ICD-10-CM

## 2021-05-18 NOTE — Patient Instructions (Signed)
Medication Instructions:  The current medical regimen is effective;  continue present plan and medications.  *If you need a refill on your cardiac medications before your next appointment, please call your pharmacy*   Follow-Up: At [facility], you and your health needs are our priority.  As part of our continuing mission to provide you with exceptional heart care, we have created designated Provider Care Teams.  These Care Teams include your primary Cardiologist (physician) and Advanced Practice Providers (APPs -  Physician Assistants and Nurse Practitioners) who all work together to provide you with the care you need, when you need it.  We recommend signing up for the patient portal called "MyChart".  Sign up information is provided on this After Visit Summary.  MyChart is used to connect with patients for Virtual Visits (Telemedicine).  Patients are able to view lab/test results, encounter notes, upcoming appointments, etc.  Non-urgent messages can be sent to your provider as well.   To learn more about what you can do with MyChart, go to [email or web address].    Your next appointment:   As needed  The format for your next appointment:   In Person  Provider:   James Hochrein, MD      

## 2021-07-29 DIAGNOSIS — Z1231 Encounter for screening mammogram for malignant neoplasm of breast: Secondary | ICD-10-CM | POA: Diagnosis not present

## 2021-09-14 DIAGNOSIS — E782 Mixed hyperlipidemia: Secondary | ICD-10-CM | POA: Diagnosis not present

## 2021-09-14 DIAGNOSIS — Z23 Encounter for immunization: Secondary | ICD-10-CM | POA: Diagnosis not present

## 2021-09-14 DIAGNOSIS — Z79899 Other long term (current) drug therapy: Secondary | ICD-10-CM | POA: Diagnosis not present

## 2021-09-14 DIAGNOSIS — E559 Vitamin D deficiency, unspecified: Secondary | ICD-10-CM | POA: Diagnosis not present

## 2021-09-14 DIAGNOSIS — G319 Degenerative disease of nervous system, unspecified: Secondary | ICD-10-CM | POA: Diagnosis not present

## 2021-09-14 DIAGNOSIS — Z6823 Body mass index (BMI) 23.0-23.9, adult: Secondary | ICD-10-CM | POA: Diagnosis not present

## 2021-09-14 DIAGNOSIS — E890 Postprocedural hypothyroidism: Secondary | ICD-10-CM | POA: Diagnosis not present

## 2021-12-14 DIAGNOSIS — R079 Chest pain, unspecified: Secondary | ICD-10-CM | POA: Diagnosis not present

## 2021-12-14 DIAGNOSIS — Y92009 Unspecified place in unspecified non-institutional (private) residence as the place of occurrence of the external cause: Secondary | ICD-10-CM | POA: Diagnosis not present

## 2021-12-14 DIAGNOSIS — W19XXXA Unspecified fall, initial encounter: Secondary | ICD-10-CM | POA: Diagnosis not present

## 2021-12-14 DIAGNOSIS — T148XXA Other injury of unspecified body region, initial encounter: Secondary | ICD-10-CM | POA: Diagnosis not present

## 2021-12-29 DIAGNOSIS — R0789 Other chest pain: Secondary | ICD-10-CM | POA: Diagnosis not present

## 2022-01-07 DIAGNOSIS — R918 Other nonspecific abnormal finding of lung field: Secondary | ICD-10-CM | POA: Diagnosis not present

## 2022-01-07 DIAGNOSIS — S2222XS Fracture of body of sternum, sequela: Secondary | ICD-10-CM | POA: Diagnosis not present

## 2022-01-07 DIAGNOSIS — S2222XA Fracture of body of sternum, initial encounter for closed fracture: Secondary | ICD-10-CM | POA: Diagnosis not present

## 2022-01-07 DIAGNOSIS — I700 Atherosclerosis of aorta: Secondary | ICD-10-CM | POA: Diagnosis not present

## 2022-01-07 DIAGNOSIS — K449 Diaphragmatic hernia without obstruction or gangrene: Secondary | ICD-10-CM | POA: Diagnosis not present

## 2022-01-07 DIAGNOSIS — I2510 Atherosclerotic heart disease of native coronary artery without angina pectoris: Secondary | ICD-10-CM | POA: Diagnosis not present

## 2022-01-11 ENCOUNTER — Encounter: Payer: Self-pay | Admitting: Family Medicine

## 2022-01-11 ENCOUNTER — Other Ambulatory Visit: Payer: Self-pay

## 2022-01-18 ENCOUNTER — Encounter: Payer: Self-pay | Admitting: Thoracic Surgery (Cardiothoracic Vascular Surgery)

## 2022-01-18 ENCOUNTER — Institutional Professional Consult (permissible substitution): Payer: Medicare PPO | Admitting: Thoracic Surgery (Cardiothoracic Vascular Surgery)

## 2022-01-18 VITALS — BP 126/88 | HR 86 | Resp 20 | Ht 63.0 in | Wt 133.0 lb

## 2022-01-18 DIAGNOSIS — S2222XA Fracture of body of sternum, initial encounter for closed fracture: Secondary | ICD-10-CM | POA: Diagnosis not present

## 2022-01-18 NOTE — Progress Notes (Signed)
PCP is Hamrick, Lorin Mercy, MD Referring Provider is Hamrick, Lorin Mercy, MD  Chief Complaint  Patient presents with   sternal fracture    Surgical consult, Chest CT 01/07/22    HPI: [name] is sent for consultation regarding a sternal fracture.  Amy Clements is an 81 year old woman with a history of breast cancer, hyperlipidemia, hypothyroidism, arthritis, and osteoporosis.  She fell forward onto some boxes she was carrying on Dec 13, 2021.  She had pain in the central part of her chest.  The pain persisted and she had a CT on 01/07/2022 which showed a transverse fracture in the mid sternal body.  It was minimally displaced.  She continues to have pain in that area.  The pain has improved over time.  She is not feeling any clicking or popping or motion.  She currently is taking Tylenol for the pain.  She was taking Aleve but no longer needs that.  Past Medical History:  Diagnosis Date   Adenomatous colon polyp    Arthritis    Cancer ([place])    breast right, lumpectomy, radiation 2004-2005   HA (headache)    Hyperlipidemia    Hypothyroidism    Osteoporosis     Past Surgical History:  Procedure Laterality Date   BREAST LUMPECTOMY     MEDIAL PARTIAL KNEE REPLACEMENT     THYROIDECTOMY     TIBIA FRACTURE SURGERY     closed reduction; right    Family History  Problem Relation Age of Onset   Heart disease Mother 56   Stroke Daughter        Aneurysm   Ataxia Neg Hx    Chorea Neg Hx    Dementia Neg Hx    Mental retardation Neg Hx    Migraines Neg Hx    Multiple sclerosis Neg Hx    Neurofibromatosis Neg Hx    Neuropathy Neg Hx    Parkinsonism Neg Hx    Seizures Neg Hx    Colon cancer Neg Hx     Social History Social History   Tobacco Use   Smoking status: Never   Smokeless tobacco: Never  Substance Use Topics   Alcohol use: No   Drug use: No    Current Outpatient Medications  Medication Sig Dispense Refill   acetaminophen (TYLENOL) 500 MG tablet Take 1,000 mg by  mouth every 6 (six) hours as needed.     levothyroxine (SYNTHROID, LEVOTHROID) 112 MCG tablet 112 mcg.      Multiple Vitamin (MULTI-VITAMIN) tablet Take 1 tablet by mouth daily.     alendronate (FOSAMAX) 70 MG tablet Take 70 mg by mouth once a week. Take with a full glass of water on an empty stomach. (Patient not taking: Reported on 01/18/2022)     topiramate (TOPAMAX) 25 MG tablet Take 1 pill at night for one week then increase to 2 pills at night     Current Facility-Administered Medications  Medication Dose Route Frequency Provider Last Rate Last Admin   0.9 %  sodium chloride infusion  500 mL Intravenous Continuous Gatha Mayer, MD        Allergies  Allergen Reactions   Neurontin [Gabapentin] Other (See Comments)    Foggy, Headache     Review of Systems  Cardiovascular:  Positive for chest pain (See HPI).  Musculoskeletal:  Positive for arthralgias.  All other systems reviewed and are negative.   BP 126/88   Pulse 86   Resp 20   Ht '5\' 3"'$  (  1.6 m)   Wt 133 lb (60.3 kg)   SpO2 96% Comment: RA  BMI 23.56 kg/m  Physical Exam Vitals reviewed.  Constitutional:      General: She is not in acute distress. HENT:     Head: Normocephalic and atraumatic.  Eyes:     General: No scleral icterus.    Extraocular Movements: Extraocular movements intact.  Cardiovascular:     Rate and Rhythm: Normal rate and regular rhythm.     Heart sounds: Normal heart sounds. No murmur heard.    No friction rub. No gallop.  Pulmonary:     Effort: Pulmonary effort is normal. No respiratory distress.     Breath sounds: Normal breath sounds. No wheezing or rales.  Musculoskeletal:     Cervical back: Neck supple.     Comments: Palpable deformity mid sternum, mildly tender to palpation.  Skin:    General: Skin is warm and dry.  Neurological:     General: No focal deficit present.     Mental Status: She is alert and oriented to person, place, and time.     Cranial Nerves: No cranial nerve  deficit.     Motor: No weakness.    Diagnostic Tests: I personally reviewed the CT images from 01/07/2022.  They are not available directly through epic but were accessible through the PACS system.  There is a transverse fracture in the sternal body partially displaced.  Impression: Amy Clements  is an 81 year old woman with a history of breast cancer, hyperlipidemia, hypothyroidism, arthritis, and osteoporosis.  She fell forward onto some boxes she was carrying on Dec 13, 2021.  She suffered a transverse fracture of the mid sternal body.  She is now about a month out from the fall.  She still has some pain but it has improved.  She is not feeling any gross clicking or popping or motion with the sternum.  Since the sternum is nonweightbearing there is no absolute need for surgical fixation.  If her pain were to worsen or she were to notice gross motion that is bothersome to her than we could certainly consider plating the sternum.  It present she has no interest in having plating done.  Advised her to avoid lifting heavy objects or bending over or lifting things with her arms or anything that would cause stress in that area for about another month.  After that she can gradually begin to increase her activities.  She may drive on a limited basis.  Appropriate precautions were discussed.  Plan: She knows to call if she were to experience any increase in pain, redness, swelling, or tenderness.  Melrose Nakayama, MD [facility] [phone]

## 2022-03-14 DIAGNOSIS — Z79899 Other long term (current) drug therapy: Secondary | ICD-10-CM | POA: Diagnosis not present

## 2022-03-14 DIAGNOSIS — Z6823 Body mass index (BMI) 23.0-23.9, adult: Secondary | ICD-10-CM | POA: Diagnosis not present

## 2022-03-14 DIAGNOSIS — G319 Degenerative disease of nervous system, unspecified: Secondary | ICD-10-CM | POA: Diagnosis not present

## 2022-03-14 DIAGNOSIS — E890 Postprocedural hypothyroidism: Secondary | ICD-10-CM | POA: Diagnosis not present

## 2022-03-14 DIAGNOSIS — E559 Vitamin D deficiency, unspecified: Secondary | ICD-10-CM | POA: Diagnosis not present

## 2022-03-14 DIAGNOSIS — E782 Mixed hyperlipidemia: Secondary | ICD-10-CM | POA: Diagnosis not present

## 2022-05-02 DIAGNOSIS — Z0000 Encounter for general adult medical examination without abnormal findings: Secondary | ICD-10-CM | POA: Diagnosis not present

## 2022-05-02 DIAGNOSIS — Z1331 Encounter for screening for depression: Secondary | ICD-10-CM | POA: Diagnosis not present

## 2022-05-02 DIAGNOSIS — Z9181 History of falling: Secondary | ICD-10-CM | POA: Diagnosis not present

## 2022-05-02 DIAGNOSIS — Z23 Encounter for immunization: Secondary | ICD-10-CM | POA: Diagnosis not present

## 2022-05-02 DIAGNOSIS — E785 Hyperlipidemia, unspecified: Secondary | ICD-10-CM | POA: Diagnosis not present

## 2022-05-02 DIAGNOSIS — Z139 Encounter for screening, unspecified: Secondary | ICD-10-CM | POA: Diagnosis not present

## 2022-08-04 DIAGNOSIS — Z1231 Encounter for screening mammogram for malignant neoplasm of breast: Secondary | ICD-10-CM | POA: Diagnosis not present

## 2022-09-13 DIAGNOSIS — E039 Hypothyroidism, unspecified: Secondary | ICD-10-CM | POA: Diagnosis not present

## 2022-09-13 DIAGNOSIS — Z6826 Body mass index (BMI) 26.0-26.9, adult: Secondary | ICD-10-CM | POA: Diagnosis not present

## 2022-10-04 ENCOUNTER — Telehealth: Payer: Self-pay

## 2022-10-04 NOTE — Patient Outreach (Signed)
  Care Coordination   Initial Visit Note   10/04/2022 Name: Amy Clements MRN: [id number] DOB: July 19, 1941  Amy Clements is a 82 y.o. year old female who sees Hamrick, Lorin Mercy, MD for primary care. I spoke with  Penelope Galas by phone today.  What matters to the patients health and wellness today?  Placed call to patient to review and offer [facility] care coordination program. Patient reports that she is living in [place] with her son and needs to get a new doctor.       SDOH assessments and interventions completed:  No     Care Coordination Interventions:  No, not indicated   Follow up plan: No further intervention required.   Encounter Outcome:  Pt. Visit Completed   Tomasa Rand, RN, BSN, CEN [place] Coordinator [phone]

## 2022-10-11 DIAGNOSIS — M810 Age-related osteoporosis without current pathological fracture: Secondary | ICD-10-CM | POA: Diagnosis not present

## 2022-10-11 DIAGNOSIS — Z0000 Encounter for general adult medical examination without abnormal findings: Secondary | ICD-10-CM | POA: Diagnosis not present

## 2022-10-11 DIAGNOSIS — E559 Vitamin D deficiency, unspecified: Secondary | ICD-10-CM | POA: Diagnosis not present

## 2022-10-11 DIAGNOSIS — Z780 Asymptomatic menopausal state: Secondary | ICD-10-CM | POA: Diagnosis not present

## 2022-10-11 DIAGNOSIS — E039 Hypothyroidism, unspecified: Secondary | ICD-10-CM | POA: Diagnosis not present

## 2022-10-11 DIAGNOSIS — Z13220 Encounter for screening for lipoid disorders: Secondary | ICD-10-CM | POA: Diagnosis not present

## 2022-10-11 DIAGNOSIS — Z6826 Body mass index (BMI) 26.0-26.9, adult: Secondary | ICD-10-CM | POA: Diagnosis not present

## 2022-11-11 DIAGNOSIS — Z1231 Encounter for screening mammogram for malignant neoplasm of breast: Secondary | ICD-10-CM | POA: Diagnosis not present

## 2022-12-19 DIAGNOSIS — Q828 Other specified congenital malformations of skin: Secondary | ICD-10-CM | POA: Diagnosis not present

## 2022-12-19 DIAGNOSIS — Z6826 Body mass index (BMI) 26.0-26.9, adult: Secondary | ICD-10-CM | POA: Diagnosis not present

## 2022-12-19 DIAGNOSIS — E039 Hypothyroidism, unspecified: Secondary | ICD-10-CM | POA: Diagnosis not present

## 2023-03-14 DIAGNOSIS — E039 Hypothyroidism, unspecified: Secondary | ICD-10-CM | POA: Diagnosis not present

## 2023-03-14 DIAGNOSIS — Z6826 Body mass index (BMI) 26.0-26.9, adult: Secondary | ICD-10-CM | POA: Diagnosis not present

## 2023-03-14 DIAGNOSIS — M810 Age-related osteoporosis without current pathological fracture: Secondary | ICD-10-CM | POA: Diagnosis not present

## 2023-09-07 DIAGNOSIS — M810 Age-related osteoporosis without current pathological fracture: Secondary | ICD-10-CM | POA: Diagnosis not present

## 2023-09-07 DIAGNOSIS — E039 Hypothyroidism, unspecified: Secondary | ICD-10-CM | POA: Diagnosis not present

## 2023-09-14 DIAGNOSIS — Z6827 Body mass index (BMI) 27.0-27.9, adult: Secondary | ICD-10-CM | POA: Diagnosis not present

## 2023-09-14 DIAGNOSIS — M810 Age-related osteoporosis without current pathological fracture: Secondary | ICD-10-CM | POA: Diagnosis not present

## 2023-09-14 DIAGNOSIS — E039 Hypothyroidism, unspecified: Secondary | ICD-10-CM | POA: Diagnosis not present

## 2023-10-16 DIAGNOSIS — Z13820 Encounter for screening for osteoporosis: Secondary | ICD-10-CM | POA: Diagnosis not present

## 2023-10-16 DIAGNOSIS — M810 Age-related osteoporosis without current pathological fracture: Secondary | ICD-10-CM | POA: Diagnosis not present

## 2023-11-07 DIAGNOSIS — R0602 Shortness of breath: Secondary | ICD-10-CM | POA: Diagnosis not present

## 2023-11-07 DIAGNOSIS — J019 Acute sinusitis, unspecified: Secondary | ICD-10-CM | POA: Diagnosis not present

## 2023-11-07 DIAGNOSIS — J069 Acute upper respiratory infection, unspecified: Secondary | ICD-10-CM | POA: Diagnosis not present

## 2023-11-07 DIAGNOSIS — Z6827 Body mass index (BMI) 27.0-27.9, adult: Secondary | ICD-10-CM | POA: Diagnosis not present

## 2023-11-07 DIAGNOSIS — R059 Cough, unspecified: Secondary | ICD-10-CM | POA: Diagnosis not present

## 2023-11-08 DIAGNOSIS — R918 Other nonspecific abnormal finding of lung field: Secondary | ICD-10-CM | POA: Diagnosis not present

## 2023-11-08 DIAGNOSIS — K449 Diaphragmatic hernia without obstruction or gangrene: Secondary | ICD-10-CM | POA: Diagnosis not present

## 2023-12-29 DIAGNOSIS — J9601 Acute respiratory failure with hypoxia: Secondary | ICD-10-CM | POA: Diagnosis not present

## 2023-12-29 DIAGNOSIS — R079 Chest pain, unspecified: Secondary | ICD-10-CM | POA: Diagnosis not present

## 2023-12-29 DIAGNOSIS — Z1152 Encounter for screening for COVID-19: Secondary | ICD-10-CM | POA: Diagnosis not present

## 2023-12-29 DIAGNOSIS — E039 Hypothyroidism, unspecified: Secondary | ICD-10-CM | POA: Diagnosis not present

## 2023-12-29 DIAGNOSIS — Z20822 Contact with and (suspected) exposure to covid-19: Secondary | ICD-10-CM | POA: Diagnosis not present

## 2023-12-29 DIAGNOSIS — R0902 Hypoxemia: Secondary | ICD-10-CM | POA: Diagnosis not present

## 2023-12-29 DIAGNOSIS — Z8249 Family history of ischemic heart disease and other diseases of the circulatory system: Secondary | ICD-10-CM | POA: Diagnosis not present

## 2023-12-29 DIAGNOSIS — R0600 Dyspnea, unspecified: Secondary | ICD-10-CM | POA: Diagnosis not present

## 2023-12-29 DIAGNOSIS — Z8261 Family history of arthritis: Secondary | ICD-10-CM | POA: Diagnosis not present

## 2023-12-29 DIAGNOSIS — Z77090 Contact with and (suspected) exposure to asbestos: Secondary | ICD-10-CM | POA: Diagnosis not present

## 2023-12-29 DIAGNOSIS — Z79890 Hormone replacement therapy (postmenopausal): Secondary | ICD-10-CM | POA: Diagnosis not present

## 2023-12-29 DIAGNOSIS — J189 Pneumonia, unspecified organism: Secondary | ICD-10-CM | POA: Diagnosis not present

## 2023-12-29 DIAGNOSIS — Z9181 History of falling: Secondary | ICD-10-CM | POA: Diagnosis not present

## 2023-12-30 DIAGNOSIS — J189 Pneumonia, unspecified organism: Secondary | ICD-10-CM | POA: Diagnosis not present

## 2023-12-30 DIAGNOSIS — E039 Hypothyroidism, unspecified: Secondary | ICD-10-CM | POA: Diagnosis not present

## 2023-12-30 DIAGNOSIS — R079 Chest pain, unspecified: Secondary | ICD-10-CM | POA: Diagnosis not present

## 2023-12-30 DIAGNOSIS — Z9181 History of falling: Secondary | ICD-10-CM | POA: Diagnosis not present

## 2023-12-30 DIAGNOSIS — J9601 Acute respiratory failure with hypoxia: Secondary | ICD-10-CM | POA: Diagnosis not present

## 2023-12-30 DIAGNOSIS — Z77090 Contact with and (suspected) exposure to asbestos: Secondary | ICD-10-CM | POA: Diagnosis not present

## 2023-12-30 DIAGNOSIS — Z79890 Hormone replacement therapy (postmenopausal): Secondary | ICD-10-CM | POA: Diagnosis not present

## 2023-12-30 DIAGNOSIS — Z8249 Family history of ischemic heart disease and other diseases of the circulatory system: Secondary | ICD-10-CM | POA: Diagnosis not present

## 2023-12-30 DIAGNOSIS — Z8261 Family history of arthritis: Secondary | ICD-10-CM | POA: Diagnosis not present

## 2023-12-30 DIAGNOSIS — Z1152 Encounter for screening for COVID-19: Secondary | ICD-10-CM | POA: Diagnosis not present

## 2023-12-31 DIAGNOSIS — Z77090 Contact with and (suspected) exposure to asbestos: Secondary | ICD-10-CM | POA: Diagnosis not present

## 2023-12-31 DIAGNOSIS — J189 Pneumonia, unspecified organism: Secondary | ICD-10-CM | POA: Diagnosis not present

## 2023-12-31 DIAGNOSIS — R079 Chest pain, unspecified: Secondary | ICD-10-CM | POA: Diagnosis not present

## 2023-12-31 DIAGNOSIS — E039 Hypothyroidism, unspecified: Secondary | ICD-10-CM | POA: Diagnosis not present

## 2024-01-08 DIAGNOSIS — Z6826 Body mass index (BMI) 26.0-26.9, adult: Secondary | ICD-10-CM | POA: Diagnosis not present

## 2024-01-08 DIAGNOSIS — R918 Other nonspecific abnormal finding of lung field: Secondary | ICD-10-CM | POA: Diagnosis not present

## 2024-01-08 DIAGNOSIS — Z5189 Encounter for other specified aftercare: Secondary | ICD-10-CM | POA: Diagnosis not present

## 2024-01-08 DIAGNOSIS — K449 Diaphragmatic hernia without obstruction or gangrene: Secondary | ICD-10-CM | POA: Diagnosis not present

## 2024-01-08 DIAGNOSIS — J189 Pneumonia, unspecified organism: Secondary | ICD-10-CM | POA: Diagnosis not present

## 2024-01-08 DIAGNOSIS — R5383 Other fatigue: Secondary | ICD-10-CM | POA: Diagnosis not present

## 2024-01-15 DIAGNOSIS — N178 Other acute kidney failure: Secondary | ICD-10-CM | POA: Diagnosis not present

## 2024-01-15 DIAGNOSIS — Z5189 Encounter for other specified aftercare: Secondary | ICD-10-CM | POA: Diagnosis not present

## 2024-01-15 DIAGNOSIS — J189 Pneumonia, unspecified organism: Secondary | ICD-10-CM | POA: Diagnosis not present

## 2024-01-15 DIAGNOSIS — R319 Hematuria, unspecified: Secondary | ICD-10-CM | POA: Diagnosis not present

## 2024-01-15 DIAGNOSIS — D72829 Elevated white blood cell count, unspecified: Secondary | ICD-10-CM | POA: Diagnosis not present

## 2024-01-30 DIAGNOSIS — E039 Hypothyroidism, unspecified: Secondary | ICD-10-CM | POA: Diagnosis not present

## 2024-01-30 DIAGNOSIS — M810 Age-related osteoporosis without current pathological fracture: Secondary | ICD-10-CM | POA: Diagnosis not present

## 2024-01-30 DIAGNOSIS — Z6827 Body mass index (BMI) 27.0-27.9, adult: Secondary | ICD-10-CM | POA: Diagnosis not present

## 2024-02-12 DIAGNOSIS — I517 Cardiomegaly: Secondary | ICD-10-CM | POA: Diagnosis not present

## 2024-02-12 DIAGNOSIS — K8020 Calculus of gallbladder without cholecystitis without obstruction: Secondary | ICD-10-CM | POA: Diagnosis not present

## 2024-02-12 DIAGNOSIS — J189 Pneumonia, unspecified organism: Secondary | ICD-10-CM | POA: Diagnosis not present

## 2024-02-12 DIAGNOSIS — I700 Atherosclerosis of aorta: Secondary | ICD-10-CM | POA: Diagnosis not present

## 2024-02-12 DIAGNOSIS — M47815 Spondylosis without myelopathy or radiculopathy, thoracolumbar region: Secondary | ICD-10-CM | POA: Diagnosis not present

## 2024-02-12 DIAGNOSIS — J439 Emphysema, unspecified: Secondary | ICD-10-CM | POA: Diagnosis not present

## 2024-02-12 DIAGNOSIS — I2510 Atherosclerotic heart disease of native coronary artery without angina pectoris: Secondary | ICD-10-CM | POA: Diagnosis not present

## 2024-02-12 DIAGNOSIS — K449 Diaphragmatic hernia without obstruction or gangrene: Secondary | ICD-10-CM | POA: Diagnosis not present

## 2024-02-16 DIAGNOSIS — J449 Chronic obstructive pulmonary disease, unspecified: Secondary | ICD-10-CM | POA: Diagnosis not present

## 2024-02-19 DIAGNOSIS — D485 Neoplasm of uncertain behavior of skin: Secondary | ICD-10-CM | POA: Diagnosis not present

## 2024-02-19 DIAGNOSIS — L578 Other skin changes due to chronic exposure to nonionizing radiation: Secondary | ICD-10-CM | POA: Diagnosis not present

## 2024-02-19 DIAGNOSIS — L821 Other seborrheic keratosis: Secondary | ICD-10-CM | POA: Diagnosis not present

## 2024-02-19 DIAGNOSIS — D1801 Hemangioma of skin and subcutaneous tissue: Secondary | ICD-10-CM | POA: Diagnosis not present

## 2024-02-19 DIAGNOSIS — C44519 Basal cell carcinoma of skin of other part of trunk: Secondary | ICD-10-CM | POA: Diagnosis not present

## 2024-03-13 DIAGNOSIS — Z6827 Body mass index (BMI) 27.0-27.9, adult: Secondary | ICD-10-CM | POA: Diagnosis not present

## 2024-03-13 DIAGNOSIS — M810 Age-related osteoporosis without current pathological fracture: Secondary | ICD-10-CM | POA: Diagnosis not present

## 2024-03-13 DIAGNOSIS — E039 Hypothyroidism, unspecified: Secondary | ICD-10-CM | POA: Diagnosis not present

## 2024-03-18 DIAGNOSIS — C44519 Basal cell carcinoma of skin of other part of trunk: Secondary | ICD-10-CM | POA: Diagnosis not present

## 2024-04-08 DIAGNOSIS — J449 Chronic obstructive pulmonary disease, unspecified: Secondary | ICD-10-CM | POA: Diagnosis not present

## 2024-04-10 DIAGNOSIS — Z1231 Encounter for screening mammogram for malignant neoplasm of breast: Secondary | ICD-10-CM | POA: Diagnosis not present

## 2024-04-13 DIAGNOSIS — I700 Atherosclerosis of aorta: Secondary | ICD-10-CM | POA: Diagnosis not present

## 2024-04-13 DIAGNOSIS — E039 Hypothyroidism, unspecified: Secondary | ICD-10-CM | POA: Diagnosis not present

## 2024-04-13 DIAGNOSIS — G319 Degenerative disease of nervous system, unspecified: Secondary | ICD-10-CM | POA: Diagnosis not present

## 2024-04-13 DIAGNOSIS — K219 Gastro-esophageal reflux disease without esophagitis: Secondary | ICD-10-CM | POA: Diagnosis not present

## 2024-04-13 DIAGNOSIS — I444 Left anterior fascicular block: Secondary | ICD-10-CM | POA: Diagnosis not present

## 2024-04-13 DIAGNOSIS — J984 Other disorders of lung: Secondary | ICD-10-CM | POA: Diagnosis not present

## 2024-04-13 DIAGNOSIS — R0789 Other chest pain: Secondary | ICD-10-CM | POA: Diagnosis not present

## 2024-04-13 DIAGNOSIS — K449 Diaphragmatic hernia without obstruction or gangrene: Secondary | ICD-10-CM | POA: Diagnosis not present

## 2024-04-13 DIAGNOSIS — R9431 Abnormal electrocardiogram [ECG] [EKG]: Secondary | ICD-10-CM | POA: Diagnosis not present

## 2024-04-13 DIAGNOSIS — R079 Chest pain, unspecified: Secondary | ICD-10-CM | POA: Diagnosis not present

## 2024-04-13 DIAGNOSIS — R9082 White matter disease, unspecified: Secondary | ICD-10-CM | POA: Diagnosis not present

## 2024-04-22 DIAGNOSIS — Z6827 Body mass index (BMI) 27.0-27.9, adult: Secondary | ICD-10-CM | POA: Diagnosis not present

## 2024-04-22 DIAGNOSIS — R109 Unspecified abdominal pain: Secondary | ICD-10-CM | POA: Diagnosis not present

## 2024-04-22 DIAGNOSIS — Z5189 Encounter for other specified aftercare: Secondary | ICD-10-CM | POA: Diagnosis not present

## 2024-04-22 DIAGNOSIS — R1011 Right upper quadrant pain: Secondary | ICD-10-CM | POA: Diagnosis not present

## 2024-04-23 DIAGNOSIS — R1011 Right upper quadrant pain: Secondary | ICD-10-CM | POA: Diagnosis not present

## 2024-04-23 DIAGNOSIS — K8020 Calculus of gallbladder without cholecystitis without obstruction: Secondary | ICD-10-CM | POA: Diagnosis not present

## 2024-04-23 DIAGNOSIS — R109 Unspecified abdominal pain: Secondary | ICD-10-CM | POA: Diagnosis not present

## 2024-05-22 DIAGNOSIS — R1011 Right upper quadrant pain: Secondary | ICD-10-CM | POA: Diagnosis not present

## 2024-05-27 DIAGNOSIS — D649 Anemia, unspecified: Secondary | ICD-10-CM | POA: Diagnosis not present

## 2024-05-27 DIAGNOSIS — E039 Hypothyroidism, unspecified: Secondary | ICD-10-CM | POA: Diagnosis not present

## 2024-05-27 DIAGNOSIS — D72829 Elevated white blood cell count, unspecified: Secondary | ICD-10-CM | POA: Diagnosis not present

## 2024-05-27 DIAGNOSIS — N178 Other acute kidney failure: Secondary | ICD-10-CM | POA: Diagnosis not present

## 2024-06-03 DIAGNOSIS — E039 Hypothyroidism, unspecified: Secondary | ICD-10-CM | POA: Diagnosis not present

## 2024-06-03 DIAGNOSIS — Z6829 Body mass index (BMI) 29.0-29.9, adult: Secondary | ICD-10-CM | POA: Diagnosis not present

## 2024-06-03 DIAGNOSIS — M810 Age-related osteoporosis without current pathological fracture: Secondary | ICD-10-CM | POA: Diagnosis not present

## 2024-06-03 DIAGNOSIS — D649 Anemia, unspecified: Secondary | ICD-10-CM | POA: Diagnosis not present

## 2024-06-10 DIAGNOSIS — R300 Dysuria: Secondary | ICD-10-CM | POA: Diagnosis not present

## 2024-07-03 DIAGNOSIS — R0602 Shortness of breath: Secondary | ICD-10-CM | POA: Diagnosis not present
